# Patient Record
Sex: Male | Born: 1945 | Race: White | Hispanic: No | Marital: Married | Smoking: Former smoker
Health system: Southern US, Community
[De-identification: ages and names within clinical notes are randomized; demographics above are authoritative.]

## PROBLEM LIST (undated history)

## (undated) DIAGNOSIS — Z9889 Other specified postprocedural states: Secondary | ICD-10-CM

## (undated) DIAGNOSIS — I739 Peripheral vascular disease, unspecified: Secondary | ICD-10-CM

## (undated) DIAGNOSIS — R112 Nausea with vomiting, unspecified: Secondary | ICD-10-CM

## (undated) DIAGNOSIS — L989 Disorder of the skin and subcutaneous tissue, unspecified: Secondary | ICD-10-CM

## (undated) DIAGNOSIS — G510 Bell's palsy: Secondary | ICD-10-CM

## (undated) DIAGNOSIS — N400 Enlarged prostate without lower urinary tract symptoms: Secondary | ICD-10-CM

## (undated) DIAGNOSIS — I82409 Acute embolism and thrombosis of unspecified deep veins of unspecified lower extremity: Secondary | ICD-10-CM

## (undated) DIAGNOSIS — H25019 Cortical age-related cataract, unspecified eye: Secondary | ICD-10-CM

## (undated) DIAGNOSIS — M1990 Unspecified osteoarthritis, unspecified site: Secondary | ICD-10-CM

## (undated) DIAGNOSIS — K219 Gastro-esophageal reflux disease without esophagitis: Secondary | ICD-10-CM

## (undated) DIAGNOSIS — R739 Hyperglycemia, unspecified: Secondary | ICD-10-CM

## (undated) HISTORY — PX: EYE SURGERY: SHX253

## (undated) HISTORY — PX: OTHER SURGICAL HISTORY: SHX169

---

## 1988-09-23 HISTORY — PX: SHOULDER SURGERY: SHX246

## 1993-09-23 HISTORY — PX: SHOULDER ARTHROSCOPY WITH ROTATOR CUFF REPAIR: SHX5685

## 2007-09-24 HISTORY — PX: HERNIA REPAIR: SHX51

## 2010-07-21 ENCOUNTER — Encounter: Admission: RE | Admit: 2010-07-21 | Discharge: 2010-07-21 | Payer: Self-pay | Admitting: Orthopedic Surgery

## 2013-07-23 ENCOUNTER — Ambulatory Visit: Payer: Self-pay | Admitting: Gastroenterology

## 2013-07-23 LAB — CBC WITH DIFFERENTIAL/PLATELET
Basophil #: 0 10*3/uL (ref 0.0–0.1)
Basophil %: 0.6 %
Eosinophil #: 0.1 10*3/uL (ref 0.0–0.7)
Eosinophil %: 1.2 %
HCT: 41.9 % (ref 40.0–52.0)
HGB: 14.4 g/dL (ref 13.0–18.0)
Lymphocyte #: 1.6 10*3/uL (ref 1.0–3.6)
Lymphocyte %: 29.7 %
MCH: 29.8 pg (ref 26.0–34.0)
MCHC: 34.4 g/dL (ref 32.0–36.0)
Monocyte %: 7.3 %
Neutrophil %: 61.2 %
Platelet: 135 10*3/uL — ABNORMAL LOW (ref 150–440)
RBC: 4.83 10*6/uL (ref 4.40–5.90)
RDW: 14.1 % (ref 11.5–14.5)
WBC: 5.3 10*3/uL (ref 3.8–10.6)

## 2013-07-27 LAB — PATHOLOGY REPORT

## 2016-03-14 ENCOUNTER — Encounter: Payer: Self-pay | Admitting: Emergency Medicine

## 2016-03-14 ENCOUNTER — Emergency Department
Admission: EM | Admit: 2016-03-14 | Discharge: 2016-03-14 | Disposition: A | Discharge status: Home / Self Care | Payer: Medicare Other | Attending: Emergency Medicine | Admitting: Emergency Medicine

## 2016-03-14 DIAGNOSIS — Y9389 Activity, other specified: Secondary | ICD-10-CM | POA: Diagnosis not present

## 2016-03-14 DIAGNOSIS — Y999 Unspecified external cause status: Secondary | ICD-10-CM | POA: Insufficient documentation

## 2016-03-14 DIAGNOSIS — S61011A Laceration without foreign body of right thumb without damage to nail, initial encounter: Secondary | ICD-10-CM | POA: Insufficient documentation

## 2016-03-14 DIAGNOSIS — Y929 Unspecified place or not applicable: Secondary | ICD-10-CM | POA: Diagnosis not present

## 2016-03-14 DIAGNOSIS — W269XXA Contact with unspecified sharp object(s), initial encounter: Secondary | ICD-10-CM | POA: Insufficient documentation

## 2016-03-14 MED ORDER — TETANUS-DIPHTH-ACELL PERTUSSIS 5-2.5-18.5 LF-MCG/0.5 IM SUSP
0.5000 mL | Freq: Once | INTRAMUSCULAR | Status: AC
  Administered 2016-03-14: 0.5 mL via INTRAMUSCULAR
  Filled 2016-03-14: qty 0.5

## 2016-03-14 MED ORDER — LIDOCAINE HCL (PF) 1 % IJ SOLN
INTRAMUSCULAR | Status: AC
  Filled 2016-03-14: qty 5

## 2016-03-14 MED ORDER — TRAMADOL HCL 50 MG PO TABS: 50.0000 mg | ORAL_TABLET | Freq: Four times a day (QID) | ORAL | Status: DC | PRN

## 2016-03-14 NOTE — ED Notes (Signed)
Grating cheese on a grater, cheese was frozen and cheese slipped, cutting right thumb.

## 2016-03-14 NOTE — ED Notes (Signed)
Pt reports laceration to right thumb. Pt wound dressed with kerlix in triage. Blood noted to dressing. Explained to pt to keep wrapped until provider at bedside.

## 2016-03-14 NOTE — ED Provider Notes (Signed)
[facility] Emergency Department Provider Note   ____________________________________________  Time seen: Approximately 7:05 PM  I have reviewed the triage vital signs and the nursing notes.   HISTORY  Chief Complaint Extremity Laceration    HPI Dwayne Smith is a 70 y.o. male patient's laceration to the right thumb. Patient state was grating cheese and  right thumb slipped and was cut on the greater.Hemorrhage  control with direct pressure. Patient denies loss sensation or loss of function of the right thumb. Patient denies any pain at this time. Patient state last tetanus shot greater than 10 years. No palliative measures for this complaint. Patient is right-hand dominant.   History reviewed. No pertinent past medical history.  There are no active problems to display for this patient.   History reviewed. No pertinent past surgical history.  Current Outpatient Rx  Name  Route  Sig  Dispense  Refill  . traMADol (ULTRAM) 50 MG tablet   Oral   Take 1 tablet (50 mg total) by mouth every 6 (six) hours as needed for moderate pain.   12 tablet   0     Allergies Review of patient's allergies indicates no known allergies.  No family history on file.  Social History Social History  Substance Use Topics  . Smoking status: Never Smoker   . Smokeless tobacco: None  . Alcohol Use: None    Review of Systems Constitutional: No fever/chills Eyes: No visual changes. ENT: No sore throat. Cardiovascular: Denies chest pain. Respiratory: Denies shortness of breath. Gastrointestinal: No abdominal pain.  No nausea, no vomiting.  No diarrhea.  No constipation. Genitourinary: Negative for dysuria. Musculoskeletal: Negative for back pain. Skin: Negative for rash. Neurological: Negative for headaches, focal weakness or numbness.  ____________________________________________   PHYSICAL EXAM:  VITAL SIGNS: ED Triage Vitals  Enc Vitals Group     BP  03/14/16 1829 162/82 mmHg     Pulse Rate 03/14/16 1829 69     Resp 03/14/16 1829 15     Temp 03/14/16 1829 98.3 F (36.8 C)     Temp Source 03/14/16 1829 Oral     SpO2 03/14/16 1829 100 %     Weight 03/14/16 1819 160 lb (72.576 kg)     Height 03/14/16 1819 5\' 8"  (1.727 m)     Head Cir --      Peak Flow --      Pain Score 03/14/16 1820 0     Pain Loc --      Pain Edu? --      Excl. in [place]? --    Constitutional: Alert and oriented. Well appearing and in no acute distress. Eyes: Conjunctivae are normal. PERRL. EOMI. Head: Atraumatic. Nose: No congestion/rhinnorhea. Mouth/Throat: Mucous membranes are moist.  Oropharynx non-erythematous. Neck: No stridor.  No cervical spine tenderness to palpation. Hematological/Lymphatic/Immunilogical: No cervical lymphadenopathy. Cardiovascular: Normal rate, regular rhythm. Grossly normal heart sounds.  Good peripheral circulation.Elevated blood pressure  Respiratory: Normal respiratory effort.  No retractions. Lungs CTAB. Gastrointestinal: Soft and nontender. No distention. No abdominal bruits. No CVA tenderness. Musculoskeletal: No lower extremity tenderness nor edema.  No joint effusions. Neurologic:  Normal speech and language. No gross focal neurologic deficits are appreciated. No gait instability. Skin:  Skin is warm, dry and intact. No rash noted. Laceration right thumb Psychiatric: Mood and affect are normal. Speech and behavior are normal.  ____________________________________________   LABS (all labs ordered are listed, but only abnormal results are displayed)  Labs Reviewed -  No data to display ____________________________________________  EKG   ____________________________________________  [id number]   ____________________________________________   PROCEDURES  Procedure(s) performed: LACERATION REPAIR Performed by: Sable Feil Authorized by: Sable Feil Consent: Verbal consent obtained. Risks and benefits: risks,  benefits and alternatives were discussed Consent given by: patient Patient identity confirmed: provided demographic data Prepped and Draped in normal sterile fashion Wound explored  Laceration Location: Right dorsal thumb  Laceration Length: 1.5cm  No Foreign Bodies seen or palpated  Anesthesia: Digital block   Local anesthetic: lidocaine 1% without epinephrine  Anesthetic total: 4 ml  Irrigation method: syringe Amount of cleaning: standard  Skin closure: 4-0 nylon   Number of sutures: 5   Technique: Interrupted Patient tolerance: Patient tolerated the procedure well with no immediate complications.   Critical Care performed: No  ____________________________________________   INITIAL IMPRESSION / ASSESSMENT AND PLAN / ED COURSE  Pertinent labs & imaging results that were available during my care of the patient were reviewed by me and considered in my medical decision making (see chart for details).  Right thumb laceration. Patient given discharge care instructions. Patient advised his sutures removed in 10 days. Patient advised family doctor urgent care clinic and removed the sutures. ____________________________________________   FINAL CLINICAL IMPRESSION(S) / ED DIAGNOSES  Final diagnoses:  Thumb laceration, right, initial encounter      NEW MEDICATIONS STARTED DURING THIS VISIT:  New Prescriptions   TRAMADOL (ULTRAM) 50 MG TABLET    Take 1 tablet (50 mg total) by mouth every 6 (six) hours as needed for moderate pain.     Note:  This document was prepared using Dragon voice recognition software and may include unintentional dictation errors.    Sable Feil, PA-C 03/14/16 [place], MD 03/14/16 2018

## 2016-03-14 NOTE — Discharge Instructions (Signed)
Wear splint for 2-3 days. Laceration Care, Adult A laceration is a cut that goes through all layers of the skin. The cut also goes into the tissue that is right under the skin. Some cuts heal on their own. Others need to be closed with stitches (sutures), staples, skin adhesive strips, or wound glue. Taking care of your cut lowers your risk of infection and helps your cut to heal better. HOW TO TAKE CARE OF YOUR CUT For stitches or staples:  Keep the wound clean and dry.  If you were given a bandage (dressing), you should change it at least one time per day or as told by your doctor. You should also change it if it gets wet or dirty.  Keep the wound completely dry for the first 24 hours or as told by your doctor. After that time, you may take a shower or a bath. However, make sure that the wound is not soaked in water until after the stitches or staples have been removed.  Clean the wound one time each day or as told by your doctor:  Wash the wound with soap and water.  Rinse the wound with water until all of the soap comes off.  Pat the wound dry with a clean towel. Do not rub the wound.  After you clean the wound, put a thin layer of antibiotic ointment on it as told by your doctor. This ointment:  Helps to prevent infection.  Keeps the bandage from sticking to the wound.  Have your stitches or staples removed as told by your doctor. If your doctor used skin adhesive strips:   Keep the wound clean and dry.  If you were given a bandage, you should change it at least one time per day or as told by your doctor. You should also change it if it gets dirty or wet.  Do not get the skin adhesive strips wet. You can take a shower or a bath, but be careful to keep the wound dry.  If the wound gets wet, pat it dry with a clean towel. Do not rub the wound.  Skin adhesive strips fall off on their own. You can trim the strips as the wound heals. Do not remove any strips that are still stuck  to the wound. They will fall off after a while. If your doctor used wound glue:  Try to keep your wound dry, but you may briefly wet it in the shower or bath. Do not soak the wound in water, such as by swimming.  After you take a shower or a bath, gently pat the wound dry with a clean towel. Do not rub the wound.  Do not do any activities that will make you really sweaty until the skin glue has fallen off on its own.  Do not apply liquid, cream, or ointment medicine to your wound while the skin glue is still on.  If you were given a bandage, you should change it at least one time per day or as told by your doctor. You should also change it if it gets dirty or wet.  If a bandage is placed over the wound, do not let the tape for the bandage touch the skin glue.  Do not pick at the glue. The skin glue usually stays on for 5-10 days. Then, it falls off of the skin. General Instructions  To help prevent scarring, make sure to cover your wound with sunscreen whenever you are outside after stitches are removed,  after adhesive strips are removed, or when wound glue stays in place and the wound is healed. Make sure to wear a sunscreen of at least 30 SPF.  Take over-the-counter and prescription medicines only as told by your doctor.  If you were given antibiotic medicine or ointment, take or apply it as told by your doctor. Do not stop using the antibiotic even if your wound is getting better.  Do not scratch or pick at the wound.  Keep all follow-up visits as told by your doctor. This is important.  Check your wound every day for signs of infection. Watch for:  Redness, swelling, or pain.  Fluid, blood, or pus.  Raise (elevate) the injured area above the level of your heart while you are sitting or lying down, if possible. GET HELP IF:  You got a tetanus shot and you have any of these problems at the injection site:  Swelling.  Very bad pain.  Redness.  Bleeding.  You have a  fever.  A wound that was closed breaks open.  You notice a bad smell coming from your wound or your bandage.  You notice something coming out of the wound, such as wood or glass.  Medicine does not help your pain.  You have more redness, swelling, or pain at the site of your wound.  You have fluid, blood, or pus coming from your wound.  You notice a change in the color of your skin near your wound.  You need to change the bandage often because fluid, blood, or pus is coming from the wound.  You start to have a new rash.  You start to have numbness around the wound. GET HELP RIGHT AWAY IF:  You have very bad swelling around the wound.  Your pain suddenly gets worse and is very bad.  You notice painful lumps near the wound or on skin that is anywhere on your body.  You have a red streak going away from your wound.  The wound is on your hand or foot and you cannot move a finger or toe like you usually can.  The wound is on your hand or foot and you notice that your fingers or toes look pale or bluish.   This information is not intended to replace advice given to you by your health care provider. Make sure you discuss any questions you have with your health care provider.   Document Released: 02/26/2008 Document Revised: 01/24/2015 Document Reviewed: 09/05/2014 Elsevier Interactive Patient Education [employer].

## 2017-03-25 ENCOUNTER — Other Ambulatory Visit: Payer: Self-pay | Admitting: Internal Medicine

## 2017-03-25 ENCOUNTER — Ambulatory Visit
Admission: RE | Admit: 2017-03-25 | Discharge: 2017-03-25 | Disposition: A | Discharge status: Home / Self Care | Payer: Medicare Other | Source: Ambulatory Visit | Attending: Internal Medicine | Admitting: Internal Medicine

## 2017-03-25 DIAGNOSIS — M79605 Pain in left leg: Secondary | ICD-10-CM

## 2017-04-17 ENCOUNTER — Encounter: Payer: Self-pay | Admitting: *Deleted

## 2017-04-17 ENCOUNTER — Emergency Department
Admission: EM | Admit: 2017-04-17 | Discharge: 2017-04-17 | Disposition: A | Discharge status: Home / Self Care | Payer: Medicare Other | Attending: Emergency Medicine | Admitting: Emergency Medicine

## 2017-04-17 DIAGNOSIS — Y92017 Garden or yard in single-family (private) house as the place of occurrence of the external cause: Secondary | ICD-10-CM | POA: Diagnosis not present

## 2017-04-17 DIAGNOSIS — S0502XA Injury of conjunctiva and corneal abrasion without foreign body, left eye, initial encounter: Secondary | ICD-10-CM | POA: Insufficient documentation

## 2017-04-17 DIAGNOSIS — T1592XA Foreign body on external eye, part unspecified, left eye, initial encounter: Secondary | ICD-10-CM

## 2017-04-17 DIAGNOSIS — W458XXA Other foreign body or object entering through skin, initial encounter: Secondary | ICD-10-CM | POA: Diagnosis not present

## 2017-04-17 DIAGNOSIS — Y998 Other external cause status: Secondary | ICD-10-CM | POA: Insufficient documentation

## 2017-04-17 DIAGNOSIS — Y93A6 Activity, grass drills: Secondary | ICD-10-CM | POA: Insufficient documentation

## 2017-04-17 MED ORDER — FLUORESCEIN SODIUM 0.6 MG OP STRP
ORAL_STRIP | OPHTHALMIC | Status: AC
  Filled 2017-04-17: qty 1

## 2017-04-17 MED ORDER — FLUORESCEIN SODIUM 0.6 MG OP STRP
1.0000 | ORAL_STRIP | Freq: Once | OPHTHALMIC | Status: AC
  Administered 2017-04-17: 1 via OPHTHALMIC
  Filled 2017-04-17: qty 1

## 2017-04-17 MED ORDER — ERYTHROMYCIN 5 MG/GM OP OINT: TOPICAL_OINTMENT | Freq: Three times a day (TID) | OPHTHALMIC | 0 refills | Status: AC

## 2017-04-17 MED ORDER — ERYTHROMYCIN 5 MG/GM OP OINT
TOPICAL_OINTMENT | Freq: Once | OPHTHALMIC | Status: AC
  Administered 2017-04-17: 1 via OPHTHALMIC
  Filled 2017-04-17: qty 1

## 2017-04-17 MED ORDER — TETRACAINE HCL 0.5 % OP SOLN
2.0000 [drp] | Freq: Once | OPHTHALMIC | Status: AC
  Administered 2017-04-17: 2 [drp] via OPHTHALMIC
  Filled 2017-04-17: qty 4

## 2017-04-17 MED ORDER — CIPROFLOXACIN HCL 0.3 % OP SOLN: 1.0000 [drp] | OPHTHALMIC | 0 refills | Status: AC

## 2017-04-17 MED ORDER — HYDROCODONE-ACETAMINOPHEN 5-325 MG PO TABS: 1.0000 | ORAL_TABLET | Freq: Four times a day (QID) | ORAL | 0 refills | Status: DC | PRN

## 2017-04-17 MED ORDER — CIPROFLOXACIN HCL 0.3 % OP SOLN
1.0000 [drp] | OPHTHALMIC | Status: DC
  Administered 2017-04-17: 1 [drp] via OPHTHALMIC
  Filled 2017-04-17: qty 2.5

## 2017-04-17 NOTE — ED Provider Notes (Signed)
[facility] Provider Note   CSN: [id number] Arrival date & time: 04/17/17  2026     History   Chief Complaint Chief Complaint  Patient presents with  . Foreign Body in [place] is a 71 y.o. male presents to the emergency department for evaluation of left eye pain. Patient states earlier today he was mowing. After mowing he was sitting down and felt something in his left eye. Patient states it could've been a piece of gravel from the shingles on the roof. Patient was rubbing his left eye, caused pain to the left eye. Pain is severe with blinking. He currently has a foreign body sensation with water into the left eye. Patient feels as a foreign body is moving around his left eye. He denies any photophobia, headache, nausea, vomiting. Patient's vision is at its baseline but a lot of blurriness to the left eye. He denies any burning facial pain or rashes.  HPI  No past medical history on file.  There are no active problems to display for this patient.   No past surgical history on file.     Home Medications    Prior to Admission medications   Medication Sig Start Date End Date Taking? Authorizing Provider  ciprofloxacin (CILOXAN) 0.3 % ophthalmic solution Place 1 drop into both eyes every 2 (two) hours. Administer 1 drop, every 2 hours, while awake, for 2 days. Then 1 drop, every 4 hours, while awake, for the next 5 days. 04/17/17 04/22/17  Duanne Guess, PA-C  erythromycin [facility]) ophthalmic ointment Place into the left eye 3 (three) times daily. Place a 1/2 inch ribbon of ointment into the lower eyelid. 04/17/17 04/27/17  Duanne Guess, PA-C  HYDROcodone-acetaminophen (NORCO) 5-325 MG tablet Take 1 tablet by mouth every 6 (six) hours as needed for moderate pain. 04/17/17   Duanne Guess, PA-C  traMADol (ULTRAM) 50 MG tablet Take 1 tablet (50 mg total) by mouth every 6 (six) hours as needed for moderate pain. 03/14/16   Sable Feil, PA-C     Family History No family history on file.  Social History Social History  Substance Use Topics  . Smoking status: Never Smoker  . Smokeless tobacco: Never Used  . Alcohol use Yes     Allergies   Patient has no known allergies.   Review of Systems Review of Systems  Constitutional: Negative for fever.  Eyes: Positive for pain, discharge and redness. Negative for photophobia and visual disturbance.  Gastrointestinal: Negative for nausea and vomiting.  Skin: Negative for rash.  Neurological: Negative for headaches.     Physical Exam Updated Vital Signs BP (!) 176/94 (BP Location: Left Arm)   Pulse 90   Temp 98.4 F (36.9 C) (Oral)   Resp 20   Ht 5\' 8"  (1.727 m)   Wt 74.8 kg (165 lb)   SpO2 97%   BMI 25.09 kg/m   Physical Exam  Constitutional: He is oriented to person, place, and time. He appears well-developed and well-nourished.  HENT:  Head: Normocephalic and atraumatic.  Right Ear: External ear normal.  Left Ear: External ear normal.  Nose: Nose normal.  Eyes: Pupils are equal, round, and reactive to light. EOM are normal. Left eye exhibits no discharge and no exudate. Foreign body (small gr of sand left eye) present in the left eye. Left conjunctiva is injected. Left conjunctiva has no hemorrhage. Left eye exhibits normal extraocular motion.  Fundoscopic exam:  The left eye shows no papilledema. The left eye shows no red reflex.  Slit lamp exam:      The left eye shows corneal abrasion, foreign body and fluorescein uptake. The left eye shows no corneal ulcer and no anterior chamber bulge.    Neck: Normal range of motion.  Cardiovascular: Regular rhythm.   Pulmonary/Chest: Effort normal. No respiratory distress.  Musculoskeletal: Normal range of motion. He exhibits no edema.  Neurological: He is alert and oriented to person, place, and time. No cranial nerve deficit.  Skin: Skin is warm. No erythema.  Psychiatric: He has a normal mood and affect.  His behavior is normal. Judgment and thought content normal.     ED Treatments / Results  Labs (all labs ordered are listed, but only abnormal results are displayed) Labs Reviewed - No data to display  EKG  EKG Interpretation None       Radiology No results found.  Procedures Procedures (including critical care time)  Medications Ordered in ED Medications  fluorescein 0.6 MG ophthalmic strip (not administered)  ciprofloxacin (CILOXAN) 0.3 % ophthalmic solution 1 drop (not administered)  erythromycin ophthalmic ointment (not administered)  fluorescein ophthalmic strip 1 strip (1 strip Left Eye Given by Other 04/17/17 2147)  tetracaine (PONTOCAINE) 0.5 % ophthalmic solution 2 drop (2 drops Left Eye Given by Other 04/17/17 2147)     Initial Impression / Assessment and Plan / ED Course  I have reviewed the triage vital signs and the nursing notes.  Pertinent labs & imaging results that were available during my care of the patient were reviewed by me and considered in my medical decision making (see chart for details).     71 year old male with foreign body sensation to the left eye. Patient rubbing his left eye increasing pain with blurred vision. Floor seen stain and tetracaine applied and showed a large corneal abrasion approximately 3 mm in diameter. No significant ulceration. Foreign body, gr of sand removed with Q-tip. Lids everted and no other foreign body visualized. Patient is given antibiotic limited use at nighttime to help lubricate the left eye. He can use antibiotic eyedrops during the day. He will call ophthalmology tomorrow to schedule follow-up appointment. He is given Norco for pain. She is educated on signs and symptoms return to the ED for.  Final Clinical Impressions(s) / ED Diagnoses   Final diagnoses:  Abrasion of left cornea, initial encounter  FB eye, left, initial encounter    New Prescriptions New Prescriptions   CIPROFLOXACIN (CILOXAN) 0.3 %  OPHTHALMIC SOLUTION    Place 1 drop into both eyes every 2 (two) hours. Administer 1 drop, every 2 hours, while awake, for 2 days. Then 1 drop, every 4 hours, while awake, for the next 5 days.   ERYTHROMYCIN (ROMYCIN) OPHTHALMIC OINTMENT    Place into the left eye 3 (three) times daily. Place a 1/2 inch ribbon of ointment into the lower eyelid.   HYDROCODONE-ACETAMINOPHEN (NORCO) 5-325 MG TABLET    Take 1 tablet by mouth every 6 (six) hours as needed for moderate pain.     Renata Caprice 04/17/17 2207    Nena Polio, MD 04/17/17 2256

## 2017-04-17 NOTE — ED Triage Notes (Signed)
Pt has FB in left eye.  Occurred today after mowing the yard at 1700.  Left eye is red.  Pt used some eyes drops without pain relief.  Pt alert.

## 2017-04-17 NOTE — Discharge Instructions (Signed)
Please use antibiotic eyedrops during the day and ointment at nighttime. Please call ophthalmologist tomorrow to schedule follow-up appointment. Take pain medication as needed for pain. Return to the ER for any worsening symptoms or urgent changes in her health.

## 2017-04-17 NOTE — ED Notes (Signed)
Patient verbalizes understanding of d/c instructions and follow-up. VS stable and pain controlled per patient.  Patient in NAD at time of d/c and denies further concerns regarding this visit. Patient stable at the time of departure from the unit, departing unit by the safest and most appropriate manner per that patients condition and limitations. Patient advised to return to the ED at any time for emergent concerns, or for new/worsening symptoms.   

## 2017-04-17 NOTE — ED Notes (Signed)
Patient was mowing, got unknown object in the eye. Eye is red and watery. Patient feels like it is up top in the eye.

## 2018-09-03 ENCOUNTER — Encounter: Payer: Self-pay | Admitting: *Deleted

## 2018-09-04 ENCOUNTER — Other Ambulatory Visit: Payer: Self-pay

## 2018-09-04 ENCOUNTER — Ambulatory Visit: Payer: Medicare Other | Admitting: Anesthesiology

## 2018-09-04 ENCOUNTER — Ambulatory Visit
Admission: RE | Admit: 2018-09-04 | Discharge: 2018-09-04 | Disposition: A | Discharge status: Home / Self Care | Payer: Medicare Other | Attending: Gastroenterology | Admitting: Gastroenterology

## 2018-09-04 ENCOUNTER — Encounter
Admission: RE | Disposition: A | Discharge status: Home / Self Care | Payer: Self-pay | Source: Home / Self Care | Attending: Gastroenterology

## 2018-09-04 DIAGNOSIS — I739 Peripheral vascular disease, unspecified: Secondary | ICD-10-CM | POA: Insufficient documentation

## 2018-09-04 DIAGNOSIS — Z87891 Personal history of nicotine dependence: Secondary | ICD-10-CM | POA: Diagnosis not present

## 2018-09-04 DIAGNOSIS — K529 Noninfective gastroenteritis and colitis, unspecified: Secondary | ICD-10-CM | POA: Insufficient documentation

## 2018-09-04 DIAGNOSIS — N400 Enlarged prostate without lower urinary tract symptoms: Secondary | ICD-10-CM | POA: Diagnosis not present

## 2018-09-04 DIAGNOSIS — Z79899 Other long term (current) drug therapy: Secondary | ICD-10-CM | POA: Insufficient documentation

## 2018-09-04 DIAGNOSIS — K219 Gastro-esophageal reflux disease without esophagitis: Secondary | ICD-10-CM | POA: Diagnosis not present

## 2018-09-04 DIAGNOSIS — K319 Disease of stomach and duodenum, unspecified: Secondary | ICD-10-CM | POA: Insufficient documentation

## 2018-09-04 DIAGNOSIS — K5730 Diverticulosis of large intestine without perforation or abscess without bleeding: Secondary | ICD-10-CM | POA: Insufficient documentation

## 2018-09-04 DIAGNOSIS — Z86010 Personal history of colonic polyps: Secondary | ICD-10-CM | POA: Diagnosis not present

## 2018-09-04 DIAGNOSIS — Z7982 Long term (current) use of aspirin: Secondary | ICD-10-CM | POA: Diagnosis not present

## 2018-09-04 DIAGNOSIS — Z1211 Encounter for screening for malignant neoplasm of colon: Secondary | ICD-10-CM | POA: Diagnosis not present

## 2018-09-04 DIAGNOSIS — D122 Benign neoplasm of ascending colon: Secondary | ICD-10-CM | POA: Insufficient documentation

## 2018-09-04 DIAGNOSIS — K449 Diaphragmatic hernia without obstruction or gangrene: Secondary | ICD-10-CM | POA: Insufficient documentation

## 2018-09-04 DIAGNOSIS — K2270 Barrett's esophagus without dysplasia: Secondary | ICD-10-CM | POA: Insufficient documentation

## 2018-09-04 HISTORY — DX: Benign prostatic hyperplasia without lower urinary tract symptoms: N40.0

## 2018-09-04 HISTORY — DX: Other specified postprocedural states: Z98.890

## 2018-09-04 HISTORY — PX: COLONOSCOPY WITH PROPOFOL: SHX5780

## 2018-09-04 HISTORY — DX: Peripheral vascular disease, unspecified: I73.9

## 2018-09-04 HISTORY — DX: Hyperglycemia, unspecified: R73.9

## 2018-09-04 HISTORY — DX: Unspecified osteoarthritis, unspecified site: M19.90

## 2018-09-04 HISTORY — DX: Other specified postprocedural states: R11.2

## 2018-09-04 HISTORY — PX: ESOPHAGOGASTRODUODENOSCOPY (EGD) WITH PROPOFOL: SHX5813

## 2018-09-04 HISTORY — DX: Cortical age-related cataract, unspecified eye: H25.019

## 2018-09-04 LAB — CBC WITH DIFFERENTIAL/PLATELET
ABS IMMATURE GRANULOCYTES: 0.01 10*3/uL (ref 0.00–0.07)
Basophils Absolute: 0 10*3/uL (ref 0.0–0.1)
Basophils Relative: 0 %
Eosinophils Absolute: 0.1 10*3/uL (ref 0.0–0.5)
Eosinophils Relative: 2 %
HCT: 45.8 % (ref 39.0–52.0)
Hemoglobin: 15.6 g/dL (ref 13.0–17.0)
Immature Granulocytes: 0 %
Lymphocytes Relative: 40 %
Lymphs Abs: 1.6 10*3/uL (ref 0.7–4.0)
MCH: 29.4 pg (ref 26.0–34.0)
MCHC: 34.1 g/dL (ref 30.0–36.0)
MCV: 86.4 fL (ref 80.0–100.0)
MONO ABS: 0.3 10*3/uL (ref 0.1–1.0)
Monocytes Relative: 8 %
Neutro Abs: 2 10*3/uL (ref 1.7–7.7)
Neutrophils Relative %: 50 %
Platelets: 138 10*3/uL — ABNORMAL LOW (ref 150–400)
RBC: 5.3 MIL/uL (ref 4.22–5.81)
RDW: 13 % (ref 11.5–15.5)
WBC: 4 10*3/uL (ref 4.0–10.5)
nRBC: 0 % (ref 0.0–0.2)

## 2018-09-04 SURGERY — COLONOSCOPY WITH PROPOFOL
Anesthesia: General

## 2018-09-04 MED ORDER — LIDOCAINE HCL (PF) 2 % IJ SOLN
INTRAMUSCULAR | Status: AC
  Filled 2018-09-04: qty 10

## 2018-09-04 MED ORDER — PROPOFOL 10 MG/ML IV BOLUS
INTRAVENOUS | Status: DC | PRN
  Administered 2018-09-04 (×2): 20 mg via INTRAVENOUS
  Administered 2018-09-04: 90 mg via INTRAVENOUS

## 2018-09-04 MED ORDER — PROPOFOL 500 MG/50ML IV EMUL
INTRAVENOUS | Status: AC
  Filled 2018-09-04: qty 50

## 2018-09-04 MED ORDER — LIDOCAINE HCL (CARDIAC) PF 100 MG/5ML IV SOSY
PREFILLED_SYRINGE | INTRAVENOUS | Status: DC | PRN
  Administered 2018-09-04: 50 mg via INTRAVENOUS

## 2018-09-04 MED ORDER — SODIUM CHLORIDE 0.9 % IV SOLN
INTRAVENOUS | Status: DC
  Administered 2018-09-04: 10:00:00 via INTRAVENOUS

## 2018-09-04 MED ORDER — PROPOFOL 500 MG/50ML IV EMUL
INTRAVENOUS | Status: DC | PRN
  Administered 2018-09-04: 135 ug/kg/min via INTRAVENOUS

## 2018-09-04 NOTE — OR Nursing (Signed)
Lab here to draw CBC with diff.

## 2018-09-04 NOTE — Anesthesia Postprocedure Evaluation (Signed)
Anesthesia Post Note  Patient: DVON JILES  Procedure(s) Performed: COLONOSCOPY WITH PROPOFOL (N/A ) ESOPHAGOGASTRODUODENOSCOPY (EGD) WITH PROPOFOL (N/A )  Patient location during evaluation: Endoscopy Anesthesia Type: General Level of consciousness: awake and alert and oriented Pain management: pain level controlled Vital Signs Assessment: post-procedure vital signs reviewed and stable Respiratory status: spontaneous breathing, nonlabored ventilation and respiratory function stable Cardiovascular status: blood pressure returned to baseline and stable Postop Assessment: no signs of nausea or vomiting Anesthetic complications: no     Last Vitals:  Vitals:   09/04/18 1107 09/04/18 1136  BP:  (!) 147/96  Pulse:    Resp:    Temp: (!) 36.1 C   SpO2:      Last Pain:  Vitals:   09/04/18 1112  TempSrc:   PainSc: 0-No pain                 Maile Linford

## 2018-09-04 NOTE — Anesthesia Post-op Follow-up Note (Signed)
Anesthesia QCDR form completed.        

## 2018-09-04 NOTE — Op Note (Signed)
[facility] Patient Name: Dwayne Smith Procedure Date: 09/04/2018 10:14 AM MRN: [id number] Account #: [id number] Date of Birth: 1946/03/14 Admit Type: Outpatient Age: 72 Room: [facility] ENDO ROOM 3 Gender: Male Note Status: Finalized Procedure:            Colonoscopy Indications:          Personal history of colonic polyps Providers:            Lollie Sails, MD Referring MD:         Leonie Douglas. Doy Hutching, MD (Referring MD) Medicines:            Monitored Anesthesia Care Complications:        No immediate complications. Procedure:            Pre-Anesthesia Assessment:                       - ASA Grade Assessment: III - A patient with severe                        systemic disease.                       After obtaining informed consent, the colonoscope was                        passed under direct vision. Throughout the procedure,                        the patient's blood pressure, pulse, and oxygen                        saturations were monitored continuously. The                        Colonoscope was introduced through the anus and                        advanced to the the cecum, identified by appendiceal                        orifice and ileocecal valve. The colonoscopy was                        performed without difficulty. The patient tolerated the                        procedure well. The quality of the bowel preparation                        was good. Findings:      Multiple small and large-mouthed diverticula were found in the sigmoid       colon, descending colon, transverse colon and ascending colon.      A 2 mm polyp was found in the sigmoid colon. The polyp was sessile. The       polyp was removed with a cold biopsy forceps. Resection and retrieval       were complete.      Two sessile polyps were found in the ascending colon. The polyps were 2       to 3 mm in size. These polyps were removed with a cold  biopsy forceps.   Resection and retrieval were complete.      Non-bleeding internal hemorrhoids were found during retroflexion and       during anoscopy. The hemorrhoids were small and Grade I (internal       hemorrhoids that do not prolapse).      No additional abnormalities were found on retroflexion. Impression:           - Diverticulosis in the sigmoid colon, in the                        descending colon, in the transverse colon and in the                        ascending colon.                       - One 2 mm polyp in the sigmoid colon, removed with a                        cold biopsy forceps. Resected and retrieved.                       - Two 2 to 3 mm polyps in the ascending colon, removed                        with a cold biopsy forceps. Resected and retrieved.                       - Non-bleeding internal hemorrhoids. Recommendation:       - Discharge patient to home.                       - Await pathology results.                       - Telephone GI clinic for pathology results in 1 week. Procedure Code(s):    --- Professional ---                       [phone], Colonoscopy, flexible; with biopsy, single or                        multiple Diagnosis Code(s):    --- Professional ---                       K64.0, First degree hemorrhoids                       D12.5, Benign neoplasm of sigmoid colon                       D12.2, Benign neoplasm of ascending colon                       Z86.010, Personal history of colonic polyps                       K57.30, Diverticulosis of large intestine without                        perforation or abscess without bleeding CPT copyright 2018 American  Medical Association. All rights reserved. The codes documented in this report are preliminary and upon coder review may  be revised to meet current compliance requirements. Lollie Sails, MD 09/04/2018 11:06:35 AM This report has been signed electronically. Number of Addenda: 0 Note Initiated On:  09/04/2018 10:14 AM Scope Withdrawal Time: 0 hours 10 minutes 54 seconds  Total Procedure Duration: 0 hours 21 minutes 4 seconds       [facility]

## 2018-09-04 NOTE — Anesthesia Preprocedure Evaluation (Signed)
Anesthesia Evaluation  Patient identified by MRN, date of birth, ID band Patient awake    Reviewed: Allergy & Precautions, NPO status , Patient's Chart, lab work & pertinent test results  History of Anesthesia Complications (+) PONV and history of anesthetic complications  Airway Mallampati: II  TM Distance: >3 FB Neck ROM: Full    Dental  (+) Poor Dentition   Pulmonary neg sleep apnea, neg COPD, former smoker,    breath sounds clear to auscultation- rhonchi (-) wheezing      Cardiovascular Exercise Tolerance: Good (-) hypertension+ Peripheral Vascular Disease  (-) CAD, (-) Past MI, (-) Cardiac Stents and (-) CABG  Rhythm:Regular Rate:Normal - Systolic murmurs and - Diastolic murmurs    Neuro/Psych neg Seizures negative neurological ROS  negative psych ROS   GI/Hepatic negative GI ROS, Neg liver ROS,   Endo/Other  negative endocrine ROSneg diabetes  Renal/GU negative Renal ROS     Musculoskeletal  (+) Arthritis ,   Abdominal (+) - obese,   Peds  Hematology negative hematology ROS (+)   Anesthesia Other Findings Past Medical History: No date: Arthritis No date: BPH (benign prostatic hyperplasia) No date: Cataract cortical, senile No date: Hyperglycemia No date: PONV (postoperative nausea and vomiting)     Comment:  shoulder surgery at [facility] No date: PVD (peripheral vascular disease) (HCC)   Reproductive/Obstetrics                             Anesthesia Physical Anesthesia Plan  ASA: II  Anesthesia Plan: General   Post-op Pain Management:    Induction: Intravenous  PONV Risk Score and Plan: 2 and Propofol infusion  Airway Management Planned: Natural Airway  Additional Equipment:   Intra-op Plan:   Post-operative Plan:   Informed Consent: I have reviewed the patients History and Physical, chart, labs and discussed the procedure including the risks, benefits and alternatives  for the proposed anesthesia with the patient or authorized representative who has indicated his/her understanding and acceptance.   Dental advisory given  Plan Discussed with: CRNA and Anesthesiologist  Anesthesia Plan Comments:         Anesthesia Quick Evaluation

## 2018-09-04 NOTE — H&P (Signed)
Outpatient short stay form Pre-procedure 09/04/2018 10:09 AM Dwayne Sails MD  Primary Physician: Fulton Reek MD  Reason for visit: EGD and colonoscopy  History of present illness: Patient is a 72 year old male presenting today for an EGD and colonoscopy in regards to history of gastroesophageal reflux as well as personal history of adenomatous colon polyps.  He tolerated his prep well.  He does take a 325 mg aspirin daily but has held that for at least 5 days.  He takes no other aspirin products or blood thinning agent.  He does take omeprazole 20 mg a day.  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    Current Facility-Administered Medications:  .  0.9 %  sodium chloride infusion, , Intravenous, Continuous, Dwayne Sails, MD  Medications Prior to Admission  Medication Sig Dispense Refill Last Dose  . aspirin 325 MG tablet Take 325 mg by mouth daily.   Past Month at Unknown time  . finasteride (PROSCAR) 5 MG tablet Take 5 mg by mouth daily.   09/03/2018 at Unknown time  . nabumetone (RELAFEN) 500 MG tablet Take 500 mg by mouth 2 (two) times daily.   09/03/2018 at Unknown time  . simvastatin (ZOCOR) 20 MG tablet Take 20 mg by mouth daily.   09/03/2018 at Unknown time  . terazosin (HYTRIN) 2 MG capsule Take 2 mg by mouth at bedtime.   09/02/2018 at Unknown time  . HYDROcodone-acetaminophen (NORCO) 5-325 MG tablet Take 1 tablet by mouth every 6 (six) hours as needed for moderate pain. (Patient not taking: Reported on 09/04/2018) 15 tablet 0 Completed Course at Unknown time  . magnesium oxide (MAG-OX) 400 MG tablet Take 400 mg by mouth daily.   Not Taking at Unknown time  . traMADol (ULTRAM) 50 MG tablet Take 1 tablet (50 mg total) by mouth every 6 (six) hours as needed for moderate pain. (Patient not taking: Reported on 09/04/2018) 12 tablet 0 Completed Course at Unknown time     No Known  Allergies   Past Medical History:  Diagnosis Date  . Arthritis   . BPH (benign prostatic hyperplasia)   . Cataract cortical, senile   . Hyperglycemia   . PONV (postoperative nausea and vomiting)    shoulder surgery at [facility]  . PVD (peripheral vascular disease) ([place])     Review of systems:      Physical Exam    Heart and lungs: Regular rate and rhythm without rub or gallop, lungs are bilaterally clear.    HEENT: Normocephalic atraumatic eyes are anicteric    Other:    Pertinant exam for procedure: Soft nontender nondistended bowel sounds positive normoactive.    Planned proceedures: EGD, colonoscopy and indicated procedures. I have discussed the risks benefits and complications of procedures to include not limited to bleeding, infection, perforation and the risk of sedation and the patient wishes to proceed.    Dwayne Sails, MD Gastroenterology 09/04/2018  10:09 AM

## 2018-09-04 NOTE — Op Note (Signed)
[facility] Gastroenterology Patient Name: Dwayne Smith Procedure Date: 09/04/2018 10:15 AM MRN: [id number] Account #: [id number] Date of Birth: [birth year] Admit Type: Outpatient Age: 72 Room: [facility] ENDO ROOM 3 Gender: Male Note Status: Finalized Procedure:            Upper GI endoscopy Indications:          Follow-up of gastro-esophageal reflux disease Providers:            Lollie Sails, MD Medicines:            Monitored Anesthesia Care Complications:        No immediate complications. Procedure:            Pre-Anesthesia Assessment:                       - ASA Grade Assessment: III - A patient with severe                        systemic disease.                       After obtaining informed consent, the endoscope was                        passed under direct vision. Throughout the procedure,                        the patient's blood pressure, pulse, and oxygen                        saturations were monitored continuously. The Endoscope                        was introduced through the mouth, and advanced to the                        third part of duodenum. The patient tolerated the                        procedure well. Findings:      The Z-line was variable. Biopsies were taken with a cold forceps for       histology.      A small hiatal hernia was present.      The exam of the esophagus was otherwise normal.      Patchy mild inflammation characterized by congestion (edema) and       erythema was found in the gastric antrum. Biopsies were taken with a       cold forceps for histology.      Biopsies were taken with a cold forceps in the gastric body for       histology.      The cardia and gastric fundus were normal on retroflexion.      The examined duodenum was normal. Impression:           - Z-line variable. Biopsied.                       - Small hiatal hernia.                       - Gastritis. Biopsied.                       -  Normal  examined duodenum.                       - Biopsies were taken with a cold forceps for histology                        in the gastric body. Recommendation:       - Perform a colonoscopy today. Procedure Code(s):    --- Professional ---                       [phone], Esophagogastroduodenoscopy, flexible, transoral;                        with biopsy, single or multiple Diagnosis Code(s):    --- Professional ---                       K22.8, Other specified diseases of esophagus                       K44.9, Diaphragmatic hernia without obstruction or                        gangrene                       K29.70, Gastritis, unspecified, without bleeding                       K21.9, Gastro-esophageal reflux disease without                        esophagitis CPT copyright 2018 American Medical Association. All rights reserved. The codes documented in this report are preliminary and upon coder review may  be revised to meet current compliance requirements. Lollie Sails, MD 09/04/2018 10:36:11 AM This report has been signed electronically. Number of Addenda: 0 Note Initiated On: 09/04/2018 10:15 AM      [facility]

## 2018-09-04 NOTE — Transfer of Care (Signed)
Immediate Anesthesia Transfer of Care Note  Patient: Dwayne Smith  Procedure(s) Performed: COLONOSCOPY WITH PROPOFOL (N/A ) ESOPHAGOGASTRODUODENOSCOPY (EGD) WITH PROPOFOL (N/A )  Patient Location: Endoscopy Unit  Anesthesia Type:General  Level of Consciousness: drowsy  Airway & Oxygen Therapy: Patient Spontanous Breathing  Post-op Assessment: Report given to RN and Post -op Vital signs reviewed and stable  Post vital signs: Reviewed and stable  Last Vitals:  Vitals Value Taken Time  BP 125/81 09/04/2018 11:05 AM  Temp    Pulse 68 09/04/2018 11:06 AM  Resp 16 09/04/2018 11:06 AM  SpO2 97 % 09/04/2018 11:06 AM  Vitals shown include unvalidated device data.  Last Pain:  Vitals:   09/04/18 0959  TempSrc: Oral  PainSc: 0-No pain         Complications: No apparent anesthesia complications

## 2018-09-08 LAB — SURGICAL PATHOLOGY

## 2018-11-30 DIAGNOSIS — I10 Essential (primary) hypertension: Secondary | ICD-10-CM | POA: Insufficient documentation

## 2019-02-04 DIAGNOSIS — D239 Other benign neoplasm of skin, unspecified: Secondary | ICD-10-CM

## 2019-02-04 HISTORY — DX: Other benign neoplasm of skin, unspecified: D23.9

## 2019-11-22 ENCOUNTER — Emergency Department (HOSPITAL_COMMUNITY)
Admission: EM | Admit: 2019-11-22 | Discharge: 2019-11-22 | Disposition: A | Discharge status: Home / Self Care | Payer: Medicare PPO | Attending: Emergency Medicine | Admitting: Emergency Medicine

## 2019-11-22 ENCOUNTER — Other Ambulatory Visit: Payer: Self-pay

## 2019-11-22 DIAGNOSIS — G510 Bell's palsy: Secondary | ICD-10-CM | POA: Insufficient documentation

## 2019-11-22 DIAGNOSIS — Z87891 Personal history of nicotine dependence: Secondary | ICD-10-CM | POA: Insufficient documentation

## 2019-11-22 DIAGNOSIS — Z79899 Other long term (current) drug therapy: Secondary | ICD-10-CM | POA: Diagnosis not present

## 2019-11-22 DIAGNOSIS — R29810 Facial weakness: Secondary | ICD-10-CM | POA: Diagnosis present

## 2019-11-22 DIAGNOSIS — Z7982 Long term (current) use of aspirin: Secondary | ICD-10-CM | POA: Diagnosis not present

## 2019-11-22 MED ORDER — VALACYCLOVIR HCL 500 MG PO TABS
1000.0000 mg | ORAL_TABLET | Freq: Once | ORAL | Status: AC
  Administered 2019-11-22: 1000 mg via ORAL
  Filled 2019-11-22: qty 2

## 2019-11-22 MED ORDER — VALACYCLOVIR HCL 1 G PO TABS: 1000.0000 mg | ORAL_TABLET | Freq: Three times a day (TID) | ORAL | 0 refills | Status: AC

## 2019-11-22 MED ORDER — PREDNISONE 10 MG PO TABS: 60.0000 mg | ORAL_TABLET | Freq: Every day | ORAL | 0 refills | Status: AC

## 2019-11-22 MED ORDER — HYPROMELLOSE (GONIOSCOPIC) 2.5 % OP SOLN: 1.0000 [drp] | Freq: Three times a day (TID) | OPHTHALMIC | 12 refills | Status: DC

## 2019-11-22 MED ORDER — PREDNISONE 20 MG PO TABS
60.0000 mg | ORAL_TABLET | Freq: Once | ORAL | Status: AC
  Administered 2019-11-22: 60 mg via ORAL
  Filled 2019-11-22: qty 3

## 2019-11-22 NOTE — Discharge Instructions (Addendum)
Take the Valtrex and steroids as directed. Follow-up with your eye doctor for your scheduled surgery to inform them of today's visit. Use the artificial tears as directed. Return to the ED if you start to have numbness in arms or legs, headache, blurry vision that is worse than usual, injuries or falls.

## 2019-11-22 NOTE — ED Triage Notes (Signed)
Pt presents to the ED by EMS for Left sided facial droop. Pt reports that he went to bed last night at 1030pm and woke up this am about 7am and felt like he couldn't blink his eye. Pt reports that he woke up at 4 am and felt fine. Pt reports that feb 14th he was helped to move a heavy object and had an "exertion injury" and had blood in the back of his eye at the opthamologist. Pt reports vision problems since then with some improvement over time. No change/worsening of vision today. Pt denies weakness, dizziness, no aphasia, no change in speech, no balance changes. Pt reports 2nd covid vaccine received [id number] with no complications. 18g IV Left AC.

## 2019-11-22 NOTE — ED Provider Notes (Signed)
[facility] EMERGENCY DEPARTMENT Provider Note   CSN: [id number] Arrival date & time: 11/22/19  1625     History Chief Complaint  Patient presents with  . Facial Droop    Dwayne Smith is a 74 y.o. male with a past medical history of arthritis presenting to the ED for facial droop that began when he woke up this morning.  Was in his usual state of health when he went to bed last night.  He felt like he had trouble blinking his left eye when he woke up.  States that a few weeks ago he was helping his son move and had an "injury in my L eye from all the exertion, they said I popped a blood vessel in there."  States that since then he has had visual disturbance including "flecks" in his eye.  This is essentially improved but is scheduled for surgery with his retina specialist in 2 days.  Denies any changes to his vision since his symptoms began this morning.  Denies any numbness in his arms or legs, weakness, injuries or falls, headache, prior CVA, anticoagulant use, changes to speech.  He does endorse cough and rhinorrhea but has had a negative Covid test and received both of his Covid vaccines in the last month.  HPI     Past Medical History:  Diagnosis Date  . Arthritis   . BPH (benign prostatic hyperplasia)   . Cataract cortical, senile   . Hyperglycemia   . PONV (postoperative nausea and vomiting)    shoulder surgery at [facility]  . PVD (peripheral vascular disease) ([place])     There are no problems to display for this patient.   Past Surgical History:  Procedure Laterality Date  . colonoscopy with polypectomy    . COLONOSCOPY WITH PROPOFOL N/A 09/04/2018   Procedure: COLONOSCOPY WITH PROPOFOL;  Surgeon: Lollie Sails, MD;  Location[facility] END[facility];  Service: Endoscopy;  Laterality: N/A;  . ESOPHAGOGASTRODUODENOSCOPY (EGD) WITH PROPOFOL N/A 09/04/2018   Procedure: ESOPHAGOGASTRODUODENOSCOPY (EGD) WITH PROPOFOL;  Surgeon: Lollie Sails, MD;  Location: [facility]  ENDOSCOPY;  Service: Endoscopy;  Laterality: N/A;  . HERNIA REPAIR  [id number]   umbilical  . SHOULDER ARTHROSCOPY WITH ROTATOR CUFF [facility]  . SHOULDER SURGERY Right 1990   bone spur       No family history on file.  Social History   Tobacco Use  . Smoking status: Former Smoker    Quit date: 1976    Years since quitting: 45.1  . Smokeless tobacco: Never Used  Substance Use Topics  . Alcohol use: Yes    Alcohol/week: 7.0 standard drinks    Types: 7 Cans of beer per week  . Drug use: No    Home Medications Prior to Admission medications   Medication Sig Start Date End Date Taking? Authorizing Provider  aspirin 325 MG tablet Take 325 mg by mouth daily.    [provider]  finasteride (PROSCAR) 5 MG tablet Take 5 mg by mouth daily.    [provider]  HYDROcodone-acetaminophen (NORCO) 5-325 MG tablet Take 1 tablet by mouth every 6 (six) hours as needed for moderate pain. Patient not taking: Reported on 09/04/2018 04/17/17   Duanne Guess, PA-C  hydroxypropyl methylcellulose / hypromellose (ISOPTO TEARS / GONIOVISC) 2.5 % ophthalmic solution Place 1 drop into the left eye 3 (three) times daily. 11/22/19   Zakari Couchman, PA-C  magnesium oxide (MAG-OX) 400 MG tablet Take 400 mg  by mouth daily.    [provider]  nabumetone (RELAFEN) 500 MG tablet Take 500 mg by mouth 2 (two) times daily.    [provider]  predniSONE (DELTASONE) 10 MG tablet Take 6 tablets (60 mg total) by mouth daily for 6 days. 11/22/19 11/28/19  Yong Wahlquist, PA-C  simvastatin (ZOCOR) 20 MG tablet Take 20 mg by mouth daily.    [provider]  terazosin (HYTRIN) 2 MG capsule Take 2 mg by mouth at bedtime.    [provider]  traMADol (ULTRAM) 50 MG tablet Take 1 tablet (50 mg total) by mouth every 6 (six) hours as needed for moderate pain. Patient not taking: Reported on 09/04/2018 03/14/16   Sable Feil, PA-C  valACYclovir (VALTREX) 1000 MG tablet  Take 1 tablet (1,000 mg total) by mouth 3 (three) times daily for 7 days. 11/22/19 11/29/19  Delia Heady, PA-C    Allergies    Patient has no known allergies.  Review of Systems   Review of Systems  Constitutional: Negative for appetite change, chills and fever.  HENT: Negative for ear pain, rhinorrhea, sneezing and sore throat.   Eyes: Negative for photophobia and visual disturbance.  Respiratory: Negative for cough, chest tightness, shortness of breath and wheezing.   Cardiovascular: Negative for chest pain and palpitations.  Gastrointestinal: Negative for abdominal pain, blood in stool, constipation, diarrhea, nausea and vomiting.  Genitourinary: Negative for dysuria, hematuria and urgency.  Musculoskeletal: Negative for myalgias.  Skin: Negative for rash.  Neurological: Positive for facial asymmetry. Negative for dizziness, weakness and light-headedness.    Physical Exam Updated Vital Signs BP (!) 175/97   Pulse 74   Temp 98.4 F (36.9 C) (Oral)   Resp 15   SpO2 98%   Physical Exam Vitals and nursing note reviewed.  Constitutional:      General: He is not in acute distress.    Appearance: He is well-developed.  HENT:     Head: Normocephalic and atraumatic.     Nose: Nose normal.  Eyes:     General: No scleral icterus.       Left eye: No discharge.     Conjunctiva/sclera: Conjunctivae normal.  Cardiovascular:     Rate and Rhythm: Normal rate and regular rhythm.     Heart sounds: Normal heart sounds. No murmur. No friction rub. No gallop.   Pulmonary:     Effort: Pulmonary effort is normal. No respiratory distress.     Breath sounds: Normal breath sounds.  Abdominal:     General: Bowel sounds are normal. There is no distension.     Palpations: Abdomen is soft.     Tenderness: There is no abdominal tenderness. There is no guarding.  Musculoskeletal:        General: Normal range of motion.     Cervical back: Normal range of motion and neck supple.  Skin:     General: Skin is warm and dry.     Findings: No rash.  Neurological:     Mental Status: He is alert and oriented to person, place, and time.     Sensory: No sensory deficit.     Motor: No weakness or abnormal muscle tone.     Coordination: Coordination normal.     Comments: L sided facial droop noted with inability to blink L eye. Eyebrows are asymmetrical.  Unable to furrow or raise eyebrows on left side.  Asymmetrical smile.  Sensation intact to light touch of face, bilateral upper and lower  extremities.  Strength 5/5 in bilateral upper and lower extremities.     ED Results / Procedures / Treatments   Labs (all labs ordered are listed, but only abnormal results are displayed) Labs Reviewed - No data to display  EKG EKG Interpretation  Date/Time:  Monday November 22 2019 16:30:50 EST Ventricular Rate:  79 PR Interval:    QRS Duration: 92 QT Interval:  376 QTC Calculation: 431 R Axis:   7 Text Interpretation: Sinus rhythm No prior ECG for comparison. No STEMI Confirmed by Antony Blackbird [phone]) on 11/22/2019 6:49:18 PM   Radiology No results found.  Procedures Procedures (including critical care time)  Medications Ordered in ED Medications  predniSONE (DELTASONE) tablet 60 mg (60 mg Oral Given 11/22/19 1838)  valACYclovir (VALTREX) tablet 1,000 mg (1,000 mg Oral Given 11/22/19 1839)    ED Course  I have reviewed the triage vital signs and the nursing notes.  Pertinent labs & imaging results that were available during my care of the patient were reviewed by me and considered in my medical decision making (see chart for details).    MDM Rules/Calculators/A&P                      73 year old male with a past medical history of arthritis presenting to the ED for facial droop that began when he woke up this morning.  Was in his usual state of health when he went to bed last night.  Reports approximately 2 to 3 weeks ago had what appears to be additional injury in his eye.  He has  been having "flaccid" in his left eye since then which has gradually improved but did not worsen today.  He is scheduled for surgery with a retina specialist in 2 days.  Felt like he had trouble blinking his left eye as well as difference in his smile.  Denies any weakness, numbness, injuries or falls, headache.  On exam left side of face is asymmetrical, unable to raise eyebrows, unable to blink.  Mouth drooping on the left side.  There is equal grip strength in bilateral upper extremities.  Normal strength noted to lower extremities.  He remains ambulatory.  Sensation intact to light touch of face.  Suspect that his symptoms are due to Bell's palsy due to presentation today.  I doubt CVA as the eyebrows are affected and he has no other neurological symptoms.  He will be discharged with antivirals, steroids and artificial tears.  I did tell him to contact his ophthalmologist regarding his surgery and willingness to proceed.  Patient is agreeable to the plan.  Given strict return precautions.  Patient is hemodynamically stable, in NAD, and able to ambulate in the ED. Evaluation does not show pathology that would require ongoing emergent intervention or inpatient treatment. I have personally reviewed and interpreted all lab work and imaging at today's ED visit. I explained the diagnosis to the patient. Pain has been managed and has no complaints prior to discharge. Patient is comfortable with above plan and is stable for discharge at this time. All questions were answered prior to disposition. Strict return precautions for returning to the ED were discussed. Encouraged follow up with PCP.   An After Visit Summary was printed and given to the patient.   Portions of this note were generated with [employer]. Dictation errors may occur despite best attempts at proofreading.  Final Clinical Impression(s) / ED Diagnoses Final diagnoses:  Left-sided Bell's palsy  Rx / DC Orders ED Discharge  Orders         Ordered    predniSONE (DELTASONE) 10 MG tablet  Daily     11/22/19 2002    valACYclovir (VALTREX) 1000 MG tablet  3 times daily     11/22/19 2002    hydroxypropyl methylcellulose / hypromellose (ISOPTO TEARS / GONIOVISC) 2.5 % ophthalmic solution  3 times daily     11/22/19 2002           Delia Heady, [place] 11/22/19 2005    Tegeler, Gwenyth Allegra, MD 11/23/19 [phone]

## 2019-11-22 NOTE — ED Notes (Signed)
Pt speaking to son on the phone

## 2019-12-07 DIAGNOSIS — Z8669 Personal history of other diseases of the nervous system and sense organs: Secondary | ICD-10-CM | POA: Insufficient documentation

## 2020-10-23 ENCOUNTER — Encounter: Payer: TRICARE For Life (TFL) | Admitting: Dermatology

## 2020-10-24 ENCOUNTER — Other Ambulatory Visit: Payer: Self-pay | Admitting: Neurology

## 2020-10-24 DIAGNOSIS — I639 Cerebral infarction, unspecified: Secondary | ICD-10-CM

## 2020-10-24 DIAGNOSIS — R531 Weakness: Secondary | ICD-10-CM | POA: Insufficient documentation

## 2020-10-24 DIAGNOSIS — M545 Low back pain, unspecified: Secondary | ICD-10-CM | POA: Insufficient documentation

## 2020-10-24 DIAGNOSIS — R202 Paresthesia of skin: Secondary | ICD-10-CM | POA: Insufficient documentation

## 2020-10-24 DIAGNOSIS — R200 Anesthesia of skin: Secondary | ICD-10-CM | POA: Insufficient documentation

## 2020-11-14 ENCOUNTER — Ambulatory Visit
Admission: RE | Admit: 2020-11-14 | Discharge: 2020-11-14 | Disposition: A | Discharge status: Home / Self Care | Payer: Medicare PPO | Source: Ambulatory Visit | Attending: Neurology | Admitting: Neurology

## 2020-11-14 ENCOUNTER — Other Ambulatory Visit: Payer: Self-pay

## 2020-11-14 DIAGNOSIS — I639 Cerebral infarction, unspecified: Secondary | ICD-10-CM

## 2020-11-14 MED ORDER — GADOBENATE DIMEGLUMINE 529 MG/ML IV SOLN
16.0000 mL | Freq: Once | INTRAVENOUS | Status: AC | PRN
  Administered 2020-11-14: 16 mL via INTRAVENOUS

## 2020-11-28 ENCOUNTER — Other Ambulatory Visit: Payer: Self-pay | Admitting: Physical Medicine & Rehabilitation

## 2020-11-28 DIAGNOSIS — G8929 Other chronic pain: Secondary | ICD-10-CM

## 2020-12-11 ENCOUNTER — Other Ambulatory Visit: Payer: Self-pay

## 2020-12-11 ENCOUNTER — Ambulatory Visit (INDEPENDENT_AMBULATORY_CARE_PROVIDER_SITE_OTHER): Payer: Medicare PPO | Admitting: Dermatology

## 2020-12-11 DIAGNOSIS — Z86018 Personal history of other benign neoplasm: Secondary | ICD-10-CM | POA: Diagnosis not present

## 2020-12-11 DIAGNOSIS — L219 Seborrheic dermatitis, unspecified: Secondary | ICD-10-CM

## 2020-12-11 DIAGNOSIS — D692 Other nonthrombocytopenic purpura: Secondary | ICD-10-CM

## 2020-12-11 DIAGNOSIS — L570 Actinic keratosis: Secondary | ICD-10-CM

## 2020-12-11 DIAGNOSIS — L820 Inflamed seborrheic keratosis: Secondary | ICD-10-CM

## 2020-12-11 DIAGNOSIS — L821 Other seborrheic keratosis: Secondary | ICD-10-CM

## 2020-12-11 DIAGNOSIS — L578 Other skin changes due to chronic exposure to nonionizing radiation: Secondary | ICD-10-CM

## 2020-12-11 DIAGNOSIS — Z1283 Encounter for screening for malignant neoplasm of skin: Secondary | ICD-10-CM | POA: Diagnosis not present

## 2020-12-11 DIAGNOSIS — D1800 Hemangioma unspecified site: Secondary | ICD-10-CM

## 2020-12-11 DIAGNOSIS — L814 Other melanin hyperpigmentation: Secondary | ICD-10-CM

## 2020-12-11 MED ORDER — KETOCONAZOLE 2 % EX CREA: 1.0000 "application " | TOPICAL_CREAM | Freq: Every day | CUTANEOUS | 5 refills | Status: AC

## 2020-12-11 NOTE — Progress Notes (Signed)
Follow-Up Visit   Subjective  Dwayne Smith is a 75 y.o. male who presents for the following: TBSE (Patient here for full body skin exam and skin cancer screening. Patient with hx of dysplastic nevus. He does have some itchy spots at the scalp and a spot at left side that has gotten rough. ). The patient presents for Total-Body Skin Exam (TBSE) for skin cancer screening and mole check.  The following portions of the chart were reviewed this encounter and updated as appropriate:   Tobacco  Allergies  Meds  Problems  Med Hx  Surg Hx  Fam Hx     Review of Systems:  No other skin or systemic complaints except as noted in HPI or Assessment and Plan.  Objective  Well appearing patient in no apparent distress; mood and affect are within normal limits.  A full examination was performed including scalp, head, eyes, ears, nose, lips, neck, chest, axillae, abdomen, back, buttocks, bilateral upper extremities, bilateral lower extremities, hands, feet, fingers, toes, fingernails, and toenails. All findings within normal limits unless otherwise noted below.  Objective  Left chest, left arm (4): Erythematous keratotic or waxy stuck-on papule or plaque.   Objective  Scalp x 23, right ear x 1, right temple x 1 (25): Erythematous thin papules/macules with gritty scale.   Objective  Chest: Pink scaliness   Assessment & Plan  Inflamed seborrheic keratosis (4) Left chest, left arm  Destruction of lesion - Left chest, left arm Complexity: simple   Destruction method: cryotherapy   Informed consent: discussed and consent obtained   Timeout:  patient name, date of birth, surgical site, and procedure verified Lesion destroyed using liquid nitrogen: Yes   Region frozen until ice ball extended beyond lesion: Yes   Outcome: patient tolerated procedure well with no complications   Post-procedure details: wound care instructions given    AK (actinic keratosis) (25) Scalp x 23, right ear x 1,  right temple x 1  Destruction of lesion - Scalp x 23, right ear x 1, right temple x 1 Complexity: simple   Destruction method: cryotherapy   Informed consent: discussed and consent obtained   Timeout:  patient name, date of birth, surgical site, and procedure verified Lesion destroyed using liquid nitrogen: Yes   Region frozen until ice ball extended beyond lesion: Yes   Outcome: patient tolerated procedure well with no complications   Post-procedure details: wound care instructions given    Seborrheic dermatitis Chest Start ketoconazole 2% cream to chest at bedtime.  Seborrheic Dermatitis  -  is a chronic persistent rash characterized by pinkness and scaling most commonly of the mid face but also can occur on the scalp (dandruff), ears; mid chest and mid back. It tends to be exacerbated by stress and cooler weather.  People who have neurologic disease may experience new onset or exacerbation of existing seborrheic dermatitis.  The condition is not curable but treatable and can be controlled.  Ordered Medications: ketoconazole (NIZORAL) 2 % cream   Lentigines - Scattered tan macules - Due to sun exposure - Benign-appering, observe - Recommend daily broad spectrum sunscreen SPF 30+ to sun-exposed areas, reapply every 2 hours as needed. - Call for any changes  Seborrheic Keratoses - Stuck-on, waxy, tan-brown papules and plaques  - Discussed benign etiology and prognosis. - Observe - Call for any changes  Melanocytic Nevi - Tan-brown and/or pink-flesh-colored symmetric macules and papules - Benign appearing on exam today - Observation - Call clinic for new or  changing moles - Recommend daily use of broad spectrum spf 30+ sunscreen to sun-exposed areas.   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  The purpose is to decrease the number of clinically evident and subclinical PreCancerous lesions to prevent progression to development of skin cancer by chemically destroying early precancer changes that may or may not be visible.  It has been shown to reduce the risk of developing skin cancer in the treated area. As a result of treatment, redness, scaling, crusting, and open sores may occur during treatment course. One or more than one of these methods may be used and may have to be used several times to control, suppress and eliminate the PreCancerous changes. Discussed treatment course, expected reaction, and possible side effects.  Purpura - Chronic; persistent and recurrent.  Treatable, but not curable. - Violaceous macules and patches, severe - Benign - Related to trauma, age, sun damage and/or use of blood thinners, chronic use of topical and/or oral steroids - Observe - Can use OTC arnica containing moisturizer such as Dermend Bruise Formula if desired - Call for worsening or other concerns  History of Dysplastic Nevi - No evidence of recurrence today at left low back 6.0cm lat to spine - Recommend regular full body skin exams - Recommend daily broad spectrum sunscreen SPF 30+ to sun-exposed areas, reapply every 2 hours as needed.  - Call if any new or changing lesions are noted between office visits  Skin cancer screening performed today.  Return in about 4 weeks (around 01/08/2021) for PDT to scalp, 3 months AK follow up.  Graciella Belton, RMA, am acting as scribe for Sarina Ser, MD . Documentation: I have  reviewed the above documentation for accuracy and completeness, and I agree with the above.  Sarina Ser, MD

## 2020-12-11 NOTE — Patient Instructions (Signed)
If you have any questions or concerns for your doctor, please call our main line at [phone] and press option 4 to reach your doctor's medical assistant. If no one answers, please leave a voicemail as directed and we will return your call as soon as possible. Messages left after 4 pm will be answered the following business day.   You may also send [place] a message via [place]. We typically respond to MyChart messages within 1-2 business days.  For prescription refills, please ask your pharmacy to contact our office. Our fax number is [phone].  If you have an urgent issue when the clinic is closed that cannot wait until the next business day, you can page your doctor at the number below.    Please note that while we do our best to be available for urgent issues outside of office hours, we are not available 24/7.   If you have an urgent issue and are unable to reach [place], you may choose to seek medical care at your doctor's office, retail clinic, urgent care center, or emergency room.  If you have a medical emergency, please immediately call 911 or go to the emergency department.  Pager Numbers  - Dr. Nehemiah Massed: [phone]  - Dr. Laurence Ferrari: [phone]  - Dr. Nicole Kindred: [phone]  In the event of inclement weather, please call our main line at [phone] for an update on the status of any delays or closures.  Dermatology Medication Tips: Please keep the boxes that topical medications come in in order to help keep track of the instructions about where and how to use these. Pharmacies typically print the medication instructions only on the boxes and not directly on the medication tubes.   If your medication is too expensive, please contact our office at [phone] option 4 or send [place] a message through [place] [facility].   We are unable to tell what your co-pay for medications will be in advance as this is different depending on your insurance coverage. However, we may be able to find a substitute  medication at lower cost or fill out paperwork to get insurance to cover a needed medication.   If a prior authorization is required to get your medication covered by your insurance company, please allow [place] 1-2 business days to complete this process.  Drug prices often vary depending on where the prescription is filled and some pharmacies may offer cheaper prices.  The website www.goodrx.com contains coupons for medications through different pharmacies. The prices here do not account for what the cost may be with help from insurance (it may be cheaper with your insurance), but the website can give you the price if you did not use any insurance.  - You can print the associated coupon and take it with your prescription to the pharmacy.  - You may also stop by our office during regular business hours and pick up a GoodRx coupon card.  - If you need your prescription sent electronically to a different pharmacy, notify our office through [facility] or by phone at [phone] option 4.   Melanoma ABCDEs  Melanoma is the most dangerous type of skin cancer, and is the leading cause of death from skin disease.  You are more likely to develop melanoma if you:  Have light-colored skin, light-colored eyes, or red or blond hair  Spend a lot of time in the sun  Tan regularly, either outdoors or in a tanning bed  Have had blistering sunburns, especially during childhood  Have a close  family member who has had a melanoma  Have atypical moles or large birthmarks  Early detection of melanoma is key since treatment is typically straightforward and cure rates are extremely high if we catch it early.   The first sign of melanoma is often a change in a mole or a new dark spot.  The ABCDE system is a way of remembering the signs of melanoma.  A for asymmetry:  The two halves do not match. B for border:  The edges of the growth are irregular. C for color:  A mixture of colors are present instead  of an even brown color. D for diameter:  Melanomas are usually (but not always) greater than 29mm - the size of a pencil eraser. E for evolution:  The spot keeps changing in size, shape, and color.  Please check your skin once per month between visits. You can use a small mirror in front and a large mirror behind you to keep an eye on the back side or your body.   If you see any new or changing lesions before your next follow-up, please call to schedule a visit.  Please continue daily skin protection including broad spectrum sunscreen SPF 30+ to sun-exposed areas, reapplying every 2 hours as needed when you're outdoors.    Cryotherapy Aftercare  . Wash gently with soap and water everyday.   Marland Kitchen Apply Vaseline and Band-Aid daily until healed.

## 2020-12-12 ENCOUNTER — Encounter: Payer: Self-pay | Admitting: Dermatology

## 2020-12-17 ENCOUNTER — Ambulatory Visit
Admission: RE | Admit: 2020-12-17 | Discharge: 2020-12-17 | Disposition: A | Discharge status: Home / Self Care | Payer: Medicare PPO | Source: Ambulatory Visit | Attending: Physical Medicine & Rehabilitation | Admitting: Physical Medicine & Rehabilitation

## 2020-12-17 ENCOUNTER — Other Ambulatory Visit: Payer: Self-pay

## 2020-12-17 DIAGNOSIS — M5441 Lumbago with sciatica, right side: Secondary | ICD-10-CM

## 2020-12-17 DIAGNOSIS — G8929 Other chronic pain: Secondary | ICD-10-CM

## 2020-12-19 ENCOUNTER — Other Ambulatory Visit (INDEPENDENT_AMBULATORY_CARE_PROVIDER_SITE_OTHER): Payer: Self-pay | Admitting: Vascular Surgery

## 2020-12-19 DIAGNOSIS — I739 Peripheral vascular disease, unspecified: Secondary | ICD-10-CM

## 2020-12-21 ENCOUNTER — Ambulatory Visit (INDEPENDENT_AMBULATORY_CARE_PROVIDER_SITE_OTHER): Payer: Medicare PPO

## 2020-12-21 ENCOUNTER — Other Ambulatory Visit: Payer: Self-pay

## 2020-12-21 ENCOUNTER — Encounter (INDEPENDENT_AMBULATORY_CARE_PROVIDER_SITE_OTHER): Payer: Self-pay | Admitting: Vascular Surgery

## 2020-12-21 ENCOUNTER — Ambulatory Visit (INDEPENDENT_AMBULATORY_CARE_PROVIDER_SITE_OTHER): Payer: Medicare PPO | Admitting: Vascular Surgery

## 2020-12-21 VITALS — BP 181/94 | HR 72 | Resp 17 | Ht 67.0 in | Wt 174.0 lb

## 2020-12-21 DIAGNOSIS — I739 Peripheral vascular disease, unspecified: Secondary | ICD-10-CM | POA: Diagnosis not present

## 2020-12-21 DIAGNOSIS — M1990 Unspecified osteoarthritis, unspecified site: Secondary | ICD-10-CM | POA: Insufficient documentation

## 2020-12-21 DIAGNOSIS — R739 Hyperglycemia, unspecified: Secondary | ICD-10-CM | POA: Insufficient documentation

## 2020-12-21 DIAGNOSIS — M79606 Pain in leg, unspecified: Secondary | ICD-10-CM

## 2020-12-21 DIAGNOSIS — L259 Unspecified contact dermatitis, unspecified cause: Secondary | ICD-10-CM | POA: Insufficient documentation

## 2020-12-21 DIAGNOSIS — M25819 Other specified joint disorders, unspecified shoulder: Secondary | ICD-10-CM | POA: Insufficient documentation

## 2020-12-21 DIAGNOSIS — M75120 Complete rotator cuff tear or rupture of unspecified shoulder, not specified as traumatic: Secondary | ICD-10-CM | POA: Insufficient documentation

## 2020-12-21 DIAGNOSIS — K635 Polyp of colon: Secondary | ICD-10-CM | POA: Insufficient documentation

## 2020-12-21 DIAGNOSIS — M4712 Other spondylosis with myelopathy, cervical region: Secondary | ICD-10-CM | POA: Diagnosis not present

## 2020-12-21 DIAGNOSIS — N138 Other obstructive and reflux uropathy: Secondary | ICD-10-CM | POA: Insufficient documentation

## 2020-12-21 DIAGNOSIS — H25019 Cortical age-related cataract, unspecified eye: Secondary | ICD-10-CM | POA: Insufficient documentation

## 2020-12-21 DIAGNOSIS — E785 Hyperlipidemia, unspecified: Secondary | ICD-10-CM | POA: Insufficient documentation

## 2020-12-21 DIAGNOSIS — E782 Mixed hyperlipidemia: Secondary | ICD-10-CM | POA: Diagnosis not present

## 2020-12-21 DIAGNOSIS — M47812 Spondylosis without myelopathy or radiculopathy, cervical region: Secondary | ICD-10-CM | POA: Insufficient documentation

## 2020-12-21 DIAGNOSIS — K429 Umbilical hernia without obstruction or gangrene: Secondary | ICD-10-CM | POA: Insufficient documentation

## 2020-12-21 DIAGNOSIS — M79603 Pain in arm, unspecified: Secondary | ICD-10-CM

## 2020-12-21 NOTE — Progress Notes (Signed)
MRN : [id number]  Dwayne Smith is a 75 y.o. (07/13/1946) male who presents with chief complaint of  Chief Complaint  Patient presents with  . New Patient (Initial Visit)    ultrasound  .  History of Present Illness:   The patient is seen for evaluation of painful lower extremities. Patient notes the pain is variable and not always associated with activity.  The pain is somewhat consistent day to day occurring on most days. The patient notes the pain also occurs with standing and routinely seems worse as the day wears on. The pain has been progressive over the past several years.   He describes a dramatic increase in his symptoms particularly arm symptoms this past Christmas when reaching up into a kitchen cabinet.  He felt a profound electrical shock shoot down his arm right much more so than left but both were affected.  Since then he has had increasing problems with his arms as well his legs.  He describes his right foot "just does not want to seem to move "when he walks.  There is a  history of back problems and DJD of the lumbar and sacral spine.  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  The patient states these symptoms are causing  a profound negative impact on quality of life and daily activities.  He has a remote history of a vascular injury while he was in the [employer] attached to a [employer] unit.  It was associated with some trauma at that time.  He was told he has a blockage and was treated with aspirin and cholesterol medication.  He was told to continue to walk and that this would recruit extra blood vessels around the blockage.  Interestingly, what ever symptoms he was having resolved and he has not been troubled with claudication type symptoms of the left for many years.  The patient denies rest pain or dangling of an extremity off the side of the bed during the night for relief. No open wounds or  sores at this time. No history of DVT or phlebitis. No prior interventions or surgeries.  ABIs performed today in the office demonstrate right equals 1.39 and left equals 1.06 with triphasic Doppler signals of both the posterior tibial and anterior tibial arteries bilaterally   Current Meds  Medication Sig  . aspirin 325 MG tablet Take 325 mg by mouth daily.  . finasteride (PROSCAR) 5 MG tablet Take 5 mg by mouth daily.  Marland Kitchen gabapentin (NEURONTIN) 100 MG capsule   . ketoconazole (NIZORAL) 2 % cream Apply 1 application topically daily. To chest at bedtime  . nabumetone (RELAFEN) 500 MG tablet Take 500 mg by mouth 2 (two) times daily.  Marland Kitchen omeprazole (PRILOSEC) 20 MG capsule   . simvastatin (ZOCOR) 20 MG tablet Take 20 mg by mouth daily.  Marland Kitchen terazosin (HYTRIN) 2 MG capsule Take 2 mg by mouth at bedtime.    Past Medical History:  Diagnosis Date  . Arthritis   . BPH (benign prostatic hyperplasia)   . Cataract cortical, senile   . Dysplastic nevus 02/04/2019   left low back 6.0 cm lat to spine, moderate atypia   . Hyperglycemia   . PONV (postoperative nausea and vomiting)    shoulder surgery at [facility]  . PVD (peripheral vascular disease) ([place])     Past Surgical History:  Procedure Laterality Date  . colonoscopy with polypectomy    .  COLONOSCOPY WITH PROPOFOL N/A 09/04/2018   Procedure: COLONOSCOPY WITH PROPOFOL;  Surgeon: Lollie Sails, MD;  Location: [facility] ENDOSCOPY;  Service: Endoscopy;  Laterality: N/A;  . ESOPHAGOGASTRODUODENOSCOPY (EGD) WITH PROPOFOL N/A 09/04/2018   Procedure: ESOPHAGOGASTRODUODENOSCOPY (EGD) WITH PROPOFOL;  Surgeon: Lollie Sails, MD;  Location: [facility] ENDOSCOPY;  Service: Endoscopy;  Laterality: N/A;  . HERNIA REPAIR  [id number]   umbilical  . SHOULDER ARTHROSCOPY WITH ROTATOR CUFF [facility]  . SHOULDER SURGERY Right 1990   bone spur    Social History Social History   Tobacco Use  . Smoking status: Former Smoker    Quit date: 1976    Years  since quitting: 46.2  . Smokeless tobacco: Never Used  Substance Use Topics  . Alcohol use: Yes    Alcohol/week: 7.0 standard drinks    Types: 7 Cans of beer per week  . Drug use: No    Family History No family history of bleeding/clotting disorders, porphyria or autoimmune disease   Allergies  Allergen Reactions  . No Known Allergies Anxiety     REVIEW OF SYSTEMS (Negative unless checked)  Constitutional: [] Weight loss  [] Fever  [] Chills Cardiac: [] Chest pain   [] Chest pressure   [] Palpitations   [] Shortness of breath when laying flat   [] Shortness of breath with exertion. Vascular:  [x] Pain in legs with walking   [x] Pain in legs at rest  [] History of DVT   [] Phlebitis   [] Swelling in legs   [] Varicose veins   [] Non-healing ulcers Pulmonary:   [] Uses home oxygen   [] Productive cough   [] Hemoptysis   [] Wheeze  [] COPD   [] Asthma Neurologic:  [] Dizziness   [] Seizures   [] History of stroke   [] History of TIA  [] Aphasia   [] Vissual changes   [] Weakness or numbness in arm   [] Weakness or numbness in leg Musculoskeletal:   [] Joint swelling   [] Joint pain   [] Low back pain Hematologic:  [] Easy bruising  [] Easy bleeding   [] Hypercoagulable state   [] Anemic Gastrointestinal:  [] Diarrhea   [] Vomiting  [x] Gastroesophageal reflux/heartburn   [] Difficulty swallowing. Genitourinary:  [] Chronic kidney disease   [] Difficult urination  [] Frequent urination   [] Blood in urine Skin:  [] Rashes   [] Ulcers  Psychological:  [] History of anxiety   []  History of major depression.  Physical Examination  Vitals:   12/21/20 1016  BP: (!) 181/94  Pulse: 72  Resp: 17  Weight: 174 lb (78.9 kg)  Height: 5\' 7"  (1.702 m)   Body mass index is 27.25 kg/m. Gen: WD/WN, NAD his gait is notable for right foot drop Head: [place]/AT, No temporalis wasting.  Ear/Nose/Throat: Hearing grossly intact, nares w/o erythema or drainage, poor dentition Eyes: PER, EOMI, sclera nonicteric.  Neck: Supple, no masses.  No bruit  or JVD.  Pulmonary:  Good air movement, clear to auscultation bilaterally, no use of accessory muscles.  Cardiac: RRR, normal S1, S2, no Murmurs. Vascular: Vessel Right Left  Radial Palpable Palpable  PT Palpable Palpable  DP Palpable Palpable  Gastrointestinal: soft, non-distended. No guarding/no peritoneal signs.  Musculoskeletal: M/S 5/5 throughout left side right side is notable for weakness of his grip as well as poor dorsiflexion and plantar flexion of his foot.  Atrophy of the right hand is noted.  Neurologic: CN 2-12 intact.   Speech is fluent. Motor exam as listed above. Psychiatric: Judgment intact, Mood & affect appropriate for pt's clinical situation. Dermatologic: No rashes or ulcers noted.  No changes consistent with cellulitis.  CBC Lab Results  Component Value Date   WBC 4.0 09/04/2018   HGB 15.6 09/04/2018   HCT 45.8 09/04/2018   MCV 86.4 09/04/2018   PLT 138 (L) 09/04/2018    BMET No results found for: NA, K, CL, CO2, GLUCOSE, BUN, CREATININE, CALCIUM, GFRNONAA, GFRAA CrCl cannot be calculated (No successful lab value found.).  COAG No results found for: INR, PROTIME  Radiology MR CERVICAL SPINE WO CONTRAST  Result Date: 12/18/2020 CLINICAL DATA:  Pain and numbness right arm, bilateral arm weakness EXAM: MRI CERVICAL SPINE WITHOUT CONTRAST TECHNIQUE: Multiplanar, multisequence MR imaging of the cervical spine was performed. No intravenous contrast was administered. COMPARISON:  None. FINDINGS: Alignment: Mild retrolisthesis at C3-C4, C4-C5, and C5-C6. Mild anterolisthesis at C7-T1. Vertebrae: Multilevel degenerative endplate irregularity with mild degenerative endplate marrow changes. Focus of marked low signal intensity at the inferior endplate of C6 may reflect a bone island. No suspicious osseous lesion. Cord: Abnormal cord T2 hyperintensity at C6-T1 primarily involving central gray matter. Posterior Fossa, vertebral arteries, paraspinal tissues: Chronic small  left cerebellar infarct. Otherwise unremarkable. Disc levels: C2-C3:  No canal or foraminal stenosis. C3-C4: Disc bulge with endplate osteophytes. Uncovertebral and facet hypertrophy. Ligamentum flavum thickening. Moderate to marked canal stenosis. Mild to moderate foraminal stenosis. C4-C5: Disc bulge with endplate osteophytes. Uncovertebral and facet hypertrophy. Ligamentum flavum thickening. Marked canal stenosis. Mild foraminal stenosis. C5-C6: Disc bulge with endplate osteophytes. Uncovertebral and facet hypertrophy. Ligamentum flavum thickening. Marked canal stenosis. Mild right and moderate to marked left foraminal stenosis. C6-C7: Disc bulge with endplate osteophytes. Uncovertebral and facet hypertrophy. Ligamentum flavum thickening. Marked canal stenosis with cord compression. Moderate to marked right and marked left foraminal stenosis. C7-T1: Left (marked) much greater than right facet hypertrophy. No canal or right foraminal stenosis. Mild left foraminal stenosis. IMPRESSION: Multilevel degenerative changes as detailed above. There is significant canal narrowing from C3-C4 through C6-C7, greatest at C6-C7. Abnormal cord signal is present from C6-T1 reflecting edema or myelomalacia. Foraminal narrowing is greatest at C5-C6 and C6-C7. These results will be called to the ordering clinician or representative by the Radiologist Assistant, and communication documented in the PACS or [employer]. Electronically Signed   By: Macy Mis M.D.   On: 12/18/2020 08:10   MR LUMBAR SPINE WO CONTRAST  Result Date: 12/18/2020 CLINICAL DATA:  Chronic bilateral low back pain with right-sided sciatica. Additional history provided by technologist: Patient reports right leg pain and weakness, symptoms for 3 months. EXAM: MRI LUMBAR SPINE WITHOUT CONTRAST TECHNIQUE: Multiplanar, multisequence MR imaging of the lumbar spine was performed. No intravenous contrast was administered. COMPARISON:  MRI of the lumbar spine  07/21/2010. FINDINGS: Segmentation: For the purposes of this dictation, five lumbar vertebrae are assumed and the caudal most well-formed intervertebral disc is designated L5-S1. Alignment: Straightening of the expected lumbar lordosis. 2 mm L4-L5 grade 1 anterolisthesis. Vertebrae: Vertebral body height is maintained. Small Schmorl node within the L4 superior endplate. No significant marrow edema or focal suspicious osseous lesion. Multilevel vertebral body hemangiomas. Conus medullaris and cauda equina: Conus extends to the L2 level. No signal abnormality within the visualized distal spinal cord. Paraspinal and other soft tissues: Right renal cysts. Paraspinal soft tissues within normal limits Disc levels: Unless otherwise stated, the level by level findings below have not significantly changed since prior MRI 07/21/2010. Mild congenital narrowing of the lumbar spinal canal. Progressive mild multilevel disc degeneration, greatest at L4-L5. T12-L1: Minimal facet arthrosis. No significant disc herniation or stenosis. L1-L2: Mild facet arthrosis.  No significant disc herniation or stenosis. L2-L3: Small disc bulge, new from the prior exam. Minimal facet arthrosis. No significant spinal canal or foraminal narrowing. L3-L4: Central posterior annular fissure, new from the prior exam. Disc bulge and endplate spurring, slightly progressed. A previously demonstrated left foraminal/extraforaminal disc protrusion is no longer appreciated. Progressive moderate facet arthrosis with ligamentum flavum hypertrophy. Progressive mild left subarticular narrowing without nerve root impingement. Central canal patent. There are now small bilateral facet joint effusions. Mild bilateral neural foraminal narrowing (greater on the right). L4-L5: Grade 1 anterolisthesis. Disc uncovering with disc bulge. Moderate facet arthrosis with ligamentum flavum hypertrophy. Progressive moderate left greater than right subarticular stenosis with  potential to affect either descending L5 nerve root. Progressive moderate central canal stenosis. Mild bilateral neural foraminal narrowing, not significantly changed. L5-S1: Minimal disc bulge. Mild facet arthrosis. Trace bilateral facet joint effusions, new from the prior exam. No significant spinal canal stenosis or neural foraminal narrowing. IMPRESSION: Lumbar spondylosis as outlined and having progressed at several levels since the prior MRI of 07/21/2010. Findings are most notably as follows. At L4-L5, there is progressive mild disc degeneration. Redemonstrated grade 1 anterolisthesis. Disc uncovering with disc bulge. Moderate facet arthrosis with ligamentum flavum hypertrophy. Progressive moderate left greater than right subarticular stenosis with potential to affect either descending L5 nerve root. Progressive moderate central canal stenosis. Mild bilateral neural foraminal narrowing, unchanged. At L3-L4, a central posterior annular fissure is new from the prior exam. Disc bulge and endplate spurring, slightly progressed. A previously demonstrated left foraminal/extraforaminal disc protrusion is no longer appreciated. Progressive moderate facet arthrosis with ligamentum flavum hypertrophy. Progressive mild left subarticular narrowing without nerve root impingement. Small bilateral facet joint effusions are new. Mild bilateral neural foraminal narrowing. At L5-S1, redemonstrated mild disc bulge and mild facet arthrosis. Trace bilateral facet joint effusions are new from the prior exam. No significant canal or foraminal stenosis. Electronically Signed   By: Kellie Simmering DO   On: 12/18/2020 08:18    Assessment/Plan 1. Arm and leg pain Recommend:  I do not find evidence of Vascular pathology that would explain the patient's symptoms  The patient has atypical pain symptoms for vascular disease.  His MRI demonstrates significant issues which I believe are the root cause of his symptoms  I do not find  evidence of Vascular pathology that would explain the patient's symptoms and I suspect the patient is c/o pseudoclaudication.  Patient should have an evaluation of his LS spine which I defer to neurosurgery.  I have placed an ambulatory consult for Dr. Cari Caraway  Noninvasive studies of the legs do not identify vascular problems  The patient should continue walking and begin a more formal exercise program. The patient should continue his antiplatelet therapy and aggressive treatment of the lipid abnormalities. The patient should begin wearing graduated compression socks 15-20 mmHg strength to control her mild edema.  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 Peripheral vascular disease ([place]) Recommend:  I do not find evidence of life style limiting vascular disease. The patient specifically denies life style limitation.  Previous noninvasive studies including ABI's of the legs do not identify critical vascular problems.  The patient should continue walking and begin a more formal exercise program. The patient should continue  his antiplatelet therapy and aggressive treatment of the lipid abnormalities.  Patient will follow-up with me on a PRN basis   4. Mixed hyperlipidemia Continue statin as ordered and reviewed, no changes at this time     Hortencia Pilar, MD  12/21/2020 11:49 AM

## 2021-01-04 ENCOUNTER — Other Ambulatory Visit: Payer: Self-pay | Admitting: Neurosurgery

## 2021-01-11 ENCOUNTER — Other Ambulatory Visit: Payer: Self-pay

## 2021-01-11 ENCOUNTER — Ambulatory Visit (INDEPENDENT_AMBULATORY_CARE_PROVIDER_SITE_OTHER): Payer: Medicare PPO

## 2021-01-11 DIAGNOSIS — L570 Actinic keratosis: Secondary | ICD-10-CM | POA: Diagnosis not present

## 2021-01-11 MED ORDER — AMINOLEVULINIC ACID HCL 20 % EX SOLR
1.0000 "application " | Freq: Once | CUTANEOUS | Status: AC
  Administered 2021-01-11: 354 mg via TOPICAL

## 2021-01-11 NOTE — Patient Instructions (Signed)

## 2021-01-11 NOTE — Progress Notes (Signed)
1. AK (actinic keratosis) Scalp  Photodynamic therapy - Scalp Procedure discussed: discussed risks, benefits, side effects. and alternatives   Prep: site scrubbed/prepped with acetone   Location:  Scalp Number of lesions:  Multiple Type of treatment:  Blue light Aminolevulinic Acid (see MAR for details): Levulan Number of Levulan sticks used:  1 Incubation time (minutes):  120 Number of minutes under lamp:  16 Number of seconds under lamp:  40 Cooling:  Floor fan Outcome: patient tolerated procedure well with no complications   Post-procedure details: sunscreen applied and aftercare instructions given to patient    Aminolevulinic Acid HCl 20 % SOLR 354 mg - Scalp

## 2021-01-17 ENCOUNTER — Other Ambulatory Visit: Payer: Self-pay

## 2021-01-17 ENCOUNTER — Other Ambulatory Visit
Admission: RE | Admit: 2021-01-17 | Discharge: 2021-01-17 | Disposition: A | Discharge status: Home / Self Care | Payer: Medicare PPO | Source: Ambulatory Visit | Attending: Neurosurgery | Admitting: Neurosurgery

## 2021-01-17 DIAGNOSIS — Z01818 Encounter for other preprocedural examination: Secondary | ICD-10-CM | POA: Diagnosis not present

## 2021-01-17 HISTORY — DX: Gastro-esophageal reflux disease without esophagitis: K21.9

## 2021-01-17 HISTORY — DX: Bell's palsy: G51.0

## 2021-01-17 HISTORY — DX: Disorder of the skin and subcutaneous tissue, unspecified: L98.9

## 2021-01-17 HISTORY — DX: Acute embolism and thrombosis of unspecified deep veins of unspecified lower extremity: I82.409

## 2021-01-17 LAB — URINALYSIS, ROUTINE W REFLEX MICROSCOPIC
Bilirubin Urine: NEGATIVE
Glucose, UA: NEGATIVE mg/dL
Hgb urine dipstick: NEGATIVE
Ketones, ur: NEGATIVE mg/dL
Leukocytes,Ua: NEGATIVE
Nitrite: NEGATIVE
Protein, ur: NEGATIVE mg/dL
Specific Gravity, Urine: 1.008 (ref 1.005–1.030)
pH: 6 (ref 5.0–8.0)

## 2021-01-17 LAB — TYPE AND SCREEN
ABO/RH(D): O POS
Antibody Screen: NEGATIVE

## 2021-01-17 LAB — SURGICAL PCR SCREEN
MRSA, PCR: NEGATIVE
Staphylococcus aureus: NEGATIVE

## 2021-01-17 LAB — PROTIME-INR
INR: 1 (ref 0.8–1.2)
Prothrombin Time: 13.6 seconds (ref 11.4–15.2)

## 2021-01-17 LAB — APTT: aPTT: 37 seconds — ABNORMAL HIGH (ref 24–36)

## 2021-01-17 NOTE — Patient Instructions (Addendum)
Your procedure is scheduled on: 01/24/21 Report to [facility]. To find out your arrival time please call [phone] between 1PM - 3PM on 01/23/21.  Remember: Instructions that are not followed completely may result in serious medical risk, up to and including death, or upon the discretion of your surgeon and anesthesiologist your surgery may need to be rescheduled.     _X__ 1. Do not eat food after midnight the night before your procedure.                 No gum chewing or hard candies. You may drink clear liquids up to 2 hours                 before you are scheduled to arrive for your surgery- DO not drink clear                 liquids within 2 hours of the start of your surgery.                 Clear Liquids include:  water, apple juice without pulp, clear carbohydrate                 drink such as Clearfast or Gatorade, Black Coffee or Tea (Do not add                 anything to coffee or tea). Diabetics water only  __X__2.  On the morning of surgery brush your teeth with toothpaste and water, you                 may rinse your mouth with mouthwash if you wish.  Do not swallow any              toothpaste of mouthwash.     _X__ 3.  No Alcohol for 24 hours before or after surgery.   _X__ 4.  Do Not Smoke or use e-cigarettes For 24 Hours Prior to Your Surgery.                 Do not use any chewable tobacco products for at least 6 hours prior to                 surgery.  ____  5.  Bring all medications with you on the day of surgery if instructed.   __X__  6.  Notify your doctor if there is any change in your medical condition      (cold, fever, infections).     Do not wear jewelry, make-up, hairpins, clips or nail polish. Do not wear lotions, powders, or perfumes.  Do not shave 48 hours prior to surgery. Men may shave face and neck. Do not bring valuables to the hospital.    [facility] is not responsible for any belongings or  valuables.  Contacts, dentures/partials or body piercings may not be worn into surgery. Bring a case for your contacts, glasses or hearing aids, a denture cup will be supplied. Leave your suitcase in the car. After surgery it may be brought to your room. For patients admitted to the hospital, discharge time is determined by your treatment team.   Patients discharged the day of surgery will not be allowed to drive home.   Please read over the following fact sheets that you were given:   MRSA Information  __X__ Take these medicines the morning of surgery with A SIP OF WATER:  1. finasteride (PROSCAR) 5 MG tablet  2. gabapentin (NEURONTIN) 300 MG capsule  3. nabumetone (RELAFEN) 500 MG tablet  4.  5.  6.  ____ Fleet Enema (as directed)   __X__ Use CHG Soap/SAGE wipes as directed  ____ Use inhalers on the day of surgery  ____ Stop metformin/Janumet/Farxiga 2 days prior to surgery    ____ Take 1/2 of usual insulin dose the night before surgery. No insulin the morning          of surgery.   ____ Stop Blood Thinners Coumadin/Plavix/Xarelto/Pleta/Pradaxa/Eliquis/Effient/Aspirin  on   Or contact your Surgeon, Cardiologist or Medical Doctor regarding  ability to stop your blood thinners  __X__ Stop Anti-inflammatories 7 days before surgery such as Advil, Ibuprofen, Motrin,  BC or Goodies Powder, Naprosyn, Naproxen, Aleve, Aspirin    __X__ Stop all herbal supplements, fish oil or vitamin E until after surgery.    ____ Bring C-Pap to the hospital.

## 2021-01-22 ENCOUNTER — Other Ambulatory Visit
Admission: RE | Admit: 2021-01-22 | Discharge: 2021-01-22 | Disposition: A | Discharge status: Home / Self Care | Payer: Medicare PPO | Source: Ambulatory Visit | Attending: Neurosurgery | Admitting: Neurosurgery

## 2021-01-22 ENCOUNTER — Other Ambulatory Visit: Payer: Self-pay

## 2021-01-22 DIAGNOSIS — Z01812 Encounter for preprocedural laboratory examination: Secondary | ICD-10-CM | POA: Insufficient documentation

## 2021-01-22 DIAGNOSIS — Z20822 Contact with and (suspected) exposure to covid-19: Secondary | ICD-10-CM | POA: Insufficient documentation

## 2021-01-22 LAB — SARS CORONAVIRUS 2 (TAT 6-24 HRS): SARS Coronavirus 2: NEGATIVE

## 2021-01-24 ENCOUNTER — Encounter: Payer: Self-pay | Admitting: Neurosurgery

## 2021-01-24 ENCOUNTER — Other Ambulatory Visit: Payer: Self-pay

## 2021-01-24 ENCOUNTER — Inpatient Hospital Stay: Payer: Medicare PPO

## 2021-01-24 ENCOUNTER — Encounter
Admission: RE | Disposition: A | Discharge status: Home / Self Care | Payer: Self-pay | Source: Ambulatory Visit | Attending: Neurosurgery

## 2021-01-24 ENCOUNTER — Inpatient Hospital Stay
Admission: RE | Admit: 2021-01-24 | Discharge: 2021-01-27 | DRG: 520 | Disposition: A | Discharge status: Home / Self Care | Payer: Medicare PPO | Attending: Neurosurgery | Admitting: Neurosurgery

## 2021-01-24 DIAGNOSIS — E785 Hyperlipidemia, unspecified: Secondary | ICD-10-CM | POA: Diagnosis present

## 2021-01-24 DIAGNOSIS — Z87891 Personal history of nicotine dependence: Secondary | ICD-10-CM

## 2021-01-24 DIAGNOSIS — N400 Enlarged prostate without lower urinary tract symptoms: Secondary | ICD-10-CM | POA: Diagnosis present

## 2021-01-24 DIAGNOSIS — Z7982 Long term (current) use of aspirin: Secondary | ICD-10-CM | POA: Diagnosis not present

## 2021-01-24 DIAGNOSIS — K219 Gastro-esophageal reflux disease without esophagitis: Secondary | ICD-10-CM | POA: Diagnosis present

## 2021-01-24 DIAGNOSIS — Z86010 Personal history of colonic polyps: Secondary | ICD-10-CM

## 2021-01-24 DIAGNOSIS — G959 Disease of spinal cord, unspecified: Secondary | ICD-10-CM | POA: Diagnosis present

## 2021-01-24 DIAGNOSIS — I739 Peripheral vascular disease, unspecified: Secondary | ICD-10-CM | POA: Diagnosis present

## 2021-01-24 DIAGNOSIS — Z419 Encounter for procedure for purposes other than remedying health state, unspecified: Secondary | ICD-10-CM

## 2021-01-24 DIAGNOSIS — M50023 Cervical disc disorder at C6-C7 level with myelopathy: Secondary | ICD-10-CM | POA: Diagnosis present

## 2021-01-24 DIAGNOSIS — Z8679 Personal history of other diseases of the circulatory system: Secondary | ICD-10-CM

## 2021-01-24 DIAGNOSIS — Z79899 Other long term (current) drug therapy: Secondary | ICD-10-CM

## 2021-01-24 DIAGNOSIS — Z20822 Contact with and (suspected) exposure to covid-19: Secondary | ICD-10-CM | POA: Diagnosis present

## 2021-01-24 LAB — ABO/RH: ABO/RH(D): O POS

## 2021-01-24 SURGERY — CERVICAL LAMINOPLASTY
Anesthesia: General

## 2021-01-24 MED ORDER — ACETAMINOPHEN 500 MG PO TABS
1000.0000 mg | ORAL_TABLET | Freq: Four times a day (QID) | ORAL | Status: AC
  Administered 2021-01-24 – 2021-01-25 (×3): 1000 mg via ORAL
  Filled 2021-01-24 (×4): qty 2

## 2021-01-24 MED ORDER — ONDANSETRON HCL 4 MG/2ML IJ SOLN: 4.0000 mg | Freq: Four times a day (QID) | INTRAMUSCULAR | Status: DC | PRN

## 2021-01-24 MED ORDER — FLEET ENEMA 7-19 GM/118ML RE ENEM
1.0000 | ENEMA | Freq: Once | RECTAL | Status: AC | PRN
  Administered 2021-01-27: 1 via RECTAL

## 2021-01-24 MED ORDER — APREPITANT 40 MG PO CAPS
ORAL_CAPSULE | ORAL | Status: AC
  Administered 2021-01-24: 40 mg via OROMUCOSAL
  Filled 2021-01-24: qty 1

## 2021-01-24 MED ORDER — ONDANSETRON HCL 4 MG/2ML IJ SOLN
INTRAMUSCULAR | Status: AC
  Filled 2021-01-24: qty 2

## 2021-01-24 MED ORDER — ONDANSETRON HCL 4 MG/2ML IJ SOLN: 4.0000 mg | Freq: Once | INTRAMUSCULAR | Status: DC | PRN

## 2021-01-24 MED ORDER — SUCCINYLCHOLINE CHLORIDE 20 MG/ML IJ SOLN
INTRAMUSCULAR | Status: DC | PRN
  Administered 2021-01-24: 100 mg via INTRAVENOUS

## 2021-01-24 MED ORDER — POTASSIUM CHLORIDE IN NACL 20-0.9 MEQ/L-% IV SOLN
INTRAVENOUS | Status: DC
  Filled 2021-01-24 (×7): qty 1000

## 2021-01-24 MED ORDER — REMIFENTANIL HCL 1 MG IV SOLR
INTRAVENOUS | Status: AC
  Filled 2021-01-24: qty 1000

## 2021-01-24 MED ORDER — FENTANYL CITRATE (PF) 100 MCG/2ML IJ SOLN
INTRAMUSCULAR | Status: AC
  Filled 2021-01-24: qty 2

## 2021-01-24 MED ORDER — CHLORHEXIDINE GLUCONATE 0.12 % MT SOLN
OROMUCOSAL | Status: AC
  Administered 2021-01-24: 15 mL via OROMUCOSAL
  Filled 2021-01-24: qty 15

## 2021-01-24 MED ORDER — SODIUM CHLORIDE 0.9 % IV SOLN
INTRAVENOUS | Status: DC | PRN
  Administered 2021-01-24: 40 mL

## 2021-01-24 MED ORDER — PROPOFOL 500 MG/50ML IV EMUL
INTRAVENOUS | Status: AC
  Filled 2021-01-24: qty 50

## 2021-01-24 MED ORDER — ONDANSETRON HCL 4 MG PO TABS: 4.0000 mg | ORAL_TABLET | Freq: Four times a day (QID) | ORAL | Status: DC | PRN

## 2021-01-24 MED ORDER — NABUMETONE 500 MG PO TABS
500.0000 mg | ORAL_TABLET | Freq: Two times a day (BID) | ORAL | Status: DC
  Administered 2021-01-24 – 2021-01-27 (×6): 500 mg via ORAL
  Filled 2021-01-24 (×7): qty 1

## 2021-01-24 MED ORDER — BUPIVACAINE-EPINEPHRINE (PF) 0.5% -1:200000 IJ SOLN
INTRAMUSCULAR | Status: DC | PRN
  Administered 2021-01-24: 10 mL

## 2021-01-24 MED ORDER — ACETAMINOPHEN 650 MG RE SUPP: 650.0000 mg | RECTAL | Status: DC | PRN

## 2021-01-24 MED ORDER — SUCCINYLCHOLINE CHLORIDE 200 MG/10ML IV SOSY
PREFILLED_SYRINGE | INTRAVENOUS | Status: AC
  Filled 2021-01-24: qty 10

## 2021-01-24 MED ORDER — SENNOSIDES-DOCUSATE SODIUM 8.6-50 MG PO TABS: 1.0000 | ORAL_TABLET | Freq: Every evening | ORAL | Status: DC | PRN

## 2021-01-24 MED ORDER — PROPOFOL 10 MG/ML IV BOLUS
INTRAVENOUS | Status: DC | PRN
  Administered 2021-01-24: 40 mg via INTRAVENOUS
  Administered 2021-01-24: 50 mg via INTRAVENOUS
  Administered 2021-01-24: 150 mg via INTRAVENOUS

## 2021-01-24 MED ORDER — HYDROMORPHONE HCL 1 MG/ML IJ SOLN
INTRAMUSCULAR | Status: AC
  Filled 2021-01-24: qty 1

## 2021-01-24 MED ORDER — METHOCARBAMOL 1000 MG/10ML IJ SOLN
500.0000 mg | Freq: Four times a day (QID) | INTRAVENOUS | Status: DC | PRN
  Filled 2021-01-24: qty 5

## 2021-01-24 MED ORDER — DEXAMETHASONE SODIUM PHOSPHATE 10 MG/ML IJ SOLN
INTRAMUSCULAR | Status: AC
  Filled 2021-01-24: qty 1

## 2021-01-24 MED ORDER — PHENOL 1.4 % MT LIQD
1.0000 | OROMUCOSAL | Status: DC | PRN
  Filled 2021-01-24: qty 177

## 2021-01-24 MED ORDER — MIDAZOLAM HCL 2 MG/2ML IJ SOLN
INTRAMUSCULAR | Status: AC
  Filled 2021-01-24: qty 2

## 2021-01-24 MED ORDER — DEXAMETHASONE SODIUM PHOSPHATE 10 MG/ML IJ SOLN
INTRAMUSCULAR | Status: DC | PRN
  Administered 2021-01-24 (×2): 10 mg via INTRAVENOUS

## 2021-01-24 MED ORDER — PROPOFOL 500 MG/50ML IV EMUL
INTRAVENOUS | Status: DC | PRN
  Administered 2021-01-24: 150 ug/kg/min via INTRAVENOUS

## 2021-01-24 MED ORDER — FAMOTIDINE 20 MG PO TABS: 20.0000 mg | ORAL_TABLET | Freq: Once | ORAL | Status: AC

## 2021-01-24 MED ORDER — SCOPOLAMINE 1 MG/3DAYS TD PT72
MEDICATED_PATCH | TRANSDERMAL | Status: AC
  Filled 2021-01-24: qty 1

## 2021-01-24 MED ORDER — CHLORHEXIDINE GLUCONATE 0.12 % MT SOLN: 15.0000 mL | Freq: Once | OROMUCOSAL | Status: AC

## 2021-01-24 MED ORDER — ACETAMINOPHEN 10 MG/ML IV SOLN
INTRAVENOUS | Status: AC
  Filled 2021-01-24: qty 100

## 2021-01-24 MED ORDER — ONDANSETRON HCL 4 MG/2ML IJ SOLN
INTRAMUSCULAR | Status: DC | PRN
  Administered 2021-01-24: 4 mg via INTRAVENOUS

## 2021-01-24 MED ORDER — FAMOTIDINE 20 MG PO TABS
ORAL_TABLET | ORAL | Status: AC
  Administered 2021-01-24: 20 mg via ORAL
  Filled 2021-01-24: qty 1

## 2021-01-24 MED ORDER — ORAL CARE MOUTH RINSE: 15.0000 mL | Freq: Once | OROMUCOSAL | Status: AC

## 2021-01-24 MED ORDER — SCOPOLAMINE 1 MG/3DAYS TD PT72
MEDICATED_PATCH | TRANSDERMAL | Status: DC | PRN
  Administered 2021-01-24: 1 via TRANSDERMAL

## 2021-01-24 MED ORDER — GLYCOPYRROLATE 0.2 MG/ML IJ SOLN
INTRAMUSCULAR | Status: AC
  Filled 2021-01-24: qty 1

## 2021-01-24 MED ORDER — LIDOCAINE HCL (PF) 2 % IJ SOLN
INTRAMUSCULAR | Status: AC
  Filled 2021-01-24: qty 5

## 2021-01-24 MED ORDER — PHENYLEPHRINE HCL (PRESSORS) 10 MG/ML IV SOLN
INTRAVENOUS | Status: DC | PRN
  Administered 2021-01-24: 100 ug via INTRAVENOUS
  Administered 2021-01-24: 200 ug via INTRAVENOUS
  Administered 2021-01-24 (×2): 100 ug via INTRAVENOUS

## 2021-01-24 MED ORDER — BISACODYL 5 MG PO TBEC
5.0000 mg | DELAYED_RELEASE_TABLET | Freq: Every day | ORAL | Status: DC | PRN
  Administered 2021-01-26: 5 mg via ORAL
  Filled 2021-01-24: qty 1

## 2021-01-24 MED ORDER — PROPOFOL 10 MG/ML IV BOLUS
INTRAVENOUS | Status: AC
  Filled 2021-01-24: qty 60

## 2021-01-24 MED ORDER — CEFAZOLIN SODIUM-DEXTROSE 2-4 GM/100ML-% IV SOLN
2.0000 g | Freq: Once | INTRAVENOUS | Status: AC
  Administered 2021-01-24: 2 g via INTRAVENOUS

## 2021-01-24 MED ORDER — LACTATED RINGERS IV SOLN: INTRAVENOUS | Status: DC

## 2021-01-24 MED ORDER — ACETAMINOPHEN 325 MG PO TABS
650.0000 mg | ORAL_TABLET | ORAL | Status: DC | PRN
  Administered 2021-01-26 – 2021-01-27 (×2): 650 mg via ORAL
  Filled 2021-01-24 (×2): qty 2

## 2021-01-24 MED ORDER — HYDROMORPHONE HCL 1 MG/ML IJ SOLN
INTRAMUSCULAR | Status: DC | PRN
  Administered 2021-01-24: .25 mg via INTRAVENOUS

## 2021-01-24 MED ORDER — CEFAZOLIN SODIUM-DEXTROSE 2-4 GM/100ML-% IV SOLN
INTRAVENOUS | Status: AC
  Filled 2021-01-24: qty 100

## 2021-01-24 MED ORDER — ACETAMINOPHEN 10 MG/ML IV SOLN
INTRAVENOUS | Status: DC | PRN
  Administered 2021-01-24: 1000 mg via INTRAVENOUS

## 2021-01-24 MED ORDER — FENTANYL CITRATE (PF) 100 MCG/2ML IJ SOLN: 25.0000 ug | INTRAMUSCULAR | Status: DC | PRN

## 2021-01-24 MED ORDER — GLYCOPYRROLATE 0.2 MG/ML IJ SOLN
INTRAMUSCULAR | Status: DC | PRN
  Administered 2021-01-24 (×2): .2 mg via INTRAVENOUS

## 2021-01-24 MED ORDER — METHOCARBAMOL 500 MG PO TABS
500.0000 mg | ORAL_TABLET | Freq: Four times a day (QID) | ORAL | Status: DC | PRN
  Administered 2021-01-27: 500 mg via ORAL
  Filled 2021-01-24: qty 1

## 2021-01-24 MED ORDER — THROMBIN 5000 UNITS EX SOLR
CUTANEOUS | Status: DC | PRN
  Administered 2021-01-24: 5000 [IU] via TOPICAL

## 2021-01-24 MED ORDER — APREPITANT 40 MG PO CAPS
40.0000 mg | ORAL_CAPSULE | Freq: Once | ORAL | Status: DC
  Filled 2021-01-24: qty 1

## 2021-01-24 MED ORDER — SODIUM CHLORIDE 0.9% FLUSH: 3.0000 mL | INTRAVENOUS | Status: DC | PRN

## 2021-01-24 MED ORDER — REMIFENTANIL HCL 1 MG IV SOLR
INTRAVENOUS | Status: DC | PRN
  Administered 2021-01-24: .1 ug/kg/min via INTRAVENOUS

## 2021-01-24 MED ORDER — OXYCODONE HCL 5 MG PO TABS
10.0000 mg | ORAL_TABLET | ORAL | Status: DC | PRN
  Administered 2021-01-25 (×2): 10 mg via ORAL
  Filled 2021-01-24 (×2): qty 2

## 2021-01-24 MED ORDER — SODIUM CHLORIDE 0.9% FLUSH
3.0000 mL | Freq: Two times a day (BID) | INTRAVENOUS | Status: DC
  Administered 2021-01-24 – 2021-01-27 (×6): 3 mL via INTRAVENOUS

## 2021-01-24 MED ORDER — GABAPENTIN 300 MG PO CAPS
300.0000 mg | ORAL_CAPSULE | Freq: Three times a day (TID) | ORAL | Status: DC
  Administered 2021-01-24 – 2021-01-27 (×8): 300 mg via ORAL
  Filled 2021-01-24 (×8): qty 1

## 2021-01-24 MED ORDER — HYDROMORPHONE HCL 1 MG/ML IJ SOLN: 0.5000 mg | INTRAMUSCULAR | Status: DC | PRN

## 2021-01-24 MED ORDER — FINASTERIDE 5 MG PO TABS
5.0000 mg | ORAL_TABLET | Freq: Every day | ORAL | Status: DC
  Administered 2021-01-25 – 2021-01-27 (×3): 5 mg via ORAL
  Filled 2021-01-24 (×3): qty 1

## 2021-01-24 MED ORDER — FENTANYL CITRATE (PF) 100 MCG/2ML IJ SOLN
INTRAMUSCULAR | Status: DC | PRN
  Administered 2021-01-24 (×3): 50 ug via INTRAVENOUS

## 2021-01-24 MED ORDER — LIDOCAINE HCL (CARDIAC) PF 100 MG/5ML IV SOSY
PREFILLED_SYRINGE | INTRAVENOUS | Status: DC | PRN
  Administered 2021-01-24: 80 mg via INTRAVENOUS

## 2021-01-24 MED ORDER — TERAZOSIN HCL 5 MG PO CAPS
5.0000 mg | ORAL_CAPSULE | Freq: Every day | ORAL | Status: DC
  Administered 2021-01-24 – 2021-01-26 (×3): 5 mg via ORAL
  Filled 2021-01-24 (×4): qty 1

## 2021-01-24 MED ORDER — MIDAZOLAM HCL 2 MG/2ML IJ SOLN
INTRAMUSCULAR | Status: DC | PRN
  Administered 2021-01-24: 2 mg via INTRAVENOUS

## 2021-01-24 MED ORDER — MENTHOL 3 MG MT LOZG
1.0000 | LOZENGE | OROMUCOSAL | Status: DC | PRN
  Filled 2021-01-24: qty 9

## 2021-01-24 MED ORDER — BUPIVACAINE HCL (PF) 0.5 % IJ SOLN
INTRAMUSCULAR | Status: DC | PRN
  Administered 2021-01-24: 20 mL

## 2021-01-24 MED ORDER — POLYETHYLENE GLYCOL 3350 17 G PO PACK
17.0000 g | PACK | Freq: Every day | ORAL | Status: DC
  Administered 2021-01-25 – 2021-01-27 (×3): 17 g via ORAL
  Filled 2021-01-24 (×3): qty 1

## 2021-01-24 MED ORDER — SODIUM CHLORIDE 0.9 % IV SOLN
INTRAVENOUS | Status: DC | PRN
  Administered 2021-01-24: 20 ug/min via INTRAVENOUS

## 2021-01-24 MED ORDER — SODIUM CHLORIDE 0.9 % IV SOLN: 250.0000 mL | INTRAVENOUS | Status: DC

## 2021-01-24 MED ORDER — OXYCODONE HCL 5 MG PO TABS
5.0000 mg | ORAL_TABLET | ORAL | Status: DC | PRN
  Administered 2021-01-24 – 2021-01-26 (×6): 5 mg via ORAL
  Filled 2021-01-24 (×6): qty 1

## 2021-01-24 MED ORDER — SIMVASTATIN 20 MG PO TABS
20.0000 mg | ORAL_TABLET | Freq: Every day | ORAL | Status: DC
  Administered 2021-01-24 – 2021-01-26 (×3): 20 mg via ORAL
  Filled 2021-01-24 (×3): qty 1

## 2021-01-24 MED ORDER — EPHEDRINE SULFATE 50 MG/ML IJ SOLN
INTRAMUSCULAR | Status: DC | PRN
  Administered 2021-01-24 (×3): 10 mg via INTRAVENOUS

## 2021-01-24 SURGICAL SUPPLY — 70 items
AGENT HMST MTR 8 SURGIFLO (HEMOSTASIS) ×1
APL PRP STRL LF DISP 70% ISPRP (MISCELLANEOUS) ×2
BASKET BONE COLLECTION (BASKET) IMPLANT
BULB RESERV EVAC DRAIN JP 100C (MISCELLANEOUS) IMPLANT
BUR NEURO DRILL SOFT 3.0X3.8M (BURR) ×2 IMPLANT
CHLORAPREP W/TINT 26 (MISCELLANEOUS) ×4 IMPLANT
COUNTER NEEDLE 20/40 LG (NEEDLE) ×2 IMPLANT
COVER WAND RF STERILE (DRAPES) ×2 IMPLANT
CUP MEDICINE 2OZ PLAST GRAD ST (MISCELLANEOUS) ×2 IMPLANT
DEPTH SLEEVE 6 (SLEEVE) ×2
DERMABOND ADVANCED (GAUZE/BANDAGES/DRESSINGS) ×1
DERMABOND ADVANCED .7 DNX12 (GAUZE/BANDAGES/DRESSINGS) ×1 IMPLANT
DRAIN CHANNEL JP 10F RND 20C F (MISCELLANEOUS) IMPLANT
DRAPE C ARM PK CFD 31 SPINE (DRAPES) ×2 IMPLANT
DRAPE LAPAROTOMY 77X122 PED (DRAPES) ×2 IMPLANT
DRAPE MICROSCOPE SPINE 48X150 (DRAPES) IMPLANT
DRAPE SURG 17X11 SM STRL (DRAPES) ×12 IMPLANT
ELECT CAUTERY BLADE TIP 2.5 (TIP) ×4
ELECT REM PT RETURN 9FT ADLT (ELECTROSURGICAL) ×2
ELECTRODE CAUTERY BLDE TIP 2.5 (TIP) ×2 IMPLANT
ELECTRODE REM PT RTRN 9FT ADLT (ELECTROSURGICAL) ×1 IMPLANT
FEE INTRAOP CADWELL SUPPLY NCS (MISCELLANEOUS) ×1 IMPLANT
FEE INTRAOP MONITOR IMPULS NCS (MISCELLANEOUS) ×1 IMPLANT
GAUZE XEROFORM 1X8 LF (GAUZE/BANDAGES/DRESSINGS) ×2 IMPLANT
GLOVE SURG SYN 8.5  E (GLOVE) ×6
GLOVE SURG SYN 8.5 E (GLOVE) ×3 IMPLANT
GOWN SRG XL LVL 3 NONREINFORCE (GOWNS) ×1 IMPLANT
GOWN STRL NON-REIN TWL XL LVL3 (GOWNS) ×2
GOWN STRL REUS W/ TWL XL LVL3 (GOWN DISPOSABLE) ×1 IMPLANT
GOWN STRL REUS W/TWL XL LVL3 (GOWN DISPOSABLE) ×2
GRADUATE 1200CC STRL 31836 (MISCELLANEOUS) ×2 IMPLANT
INTRAOP CADWELL SUPPLY FEE NCS (MISCELLANEOUS) ×1
INTRAOP DISP SUPPLY FEE NCS (MISCELLANEOUS) ×2
INTRAOP MONITOR FEE IMPULS NCS (MISCELLANEOUS) ×1
INTRAOP MONITOR FEE IMPULSE (MISCELLANEOUS) ×2
KIT TURNOVER KIT A (KITS) ×2 IMPLANT
MANIFOLD NEPTUNE II (INSTRUMENTS) ×2 IMPLANT
MARKER SKIN DUAL TIP RULER LAB (MISCELLANEOUS) ×4 IMPLANT
NDL SAFETY ECLIPSE 18X1.5 (NEEDLE) ×1 IMPLANT
NEEDLE HYPO 18GX1.5 SHARP (NEEDLE) ×2
NEEDLE HYPO 22GX1.5 SAFETY (NEEDLE) ×4 IMPLANT
NS IRRIG 1000ML POUR BTL (IV SOLUTION) ×2 IMPLANT
PACK LAMINECTOMY NEURO (CUSTOM PROCEDURE TRAY) ×2 IMPLANT
PAD ARMBOARD 7.5X6 YLW CONV (MISCELLANEOUS) ×2 IMPLANT
PIN CASPAR 14 (PIN) IMPLANT
PIN CASPAR 14MM (PIN)
PIN MAYFIELD SKULL DISP (PIN) ×2 IMPLANT
PLATE CANOPY GRAFT ADJ 7 (Plate) ×1 IMPLANT
PLATE CANOPY SHELF ADJ 5 (Plate) ×8 IMPLANT
PLATE CANOPY SHELF ADJ 5 TT (Plate) ×4 IMPLANT
PLATE CANOPY SHELF ADJ 7 (Plate) ×2 IMPLANT
SCREW NONLOCK HEX LP S 3.5X56 (Screw) ×40 IMPLANT
SLEEVE DEPTH 6 (SLEEVE) ×1 IMPLANT
SPOGE SURGIFLO 8M (HEMOSTASIS) ×2
SPONGE KITTNER 5P (MISCELLANEOUS) IMPLANT
SPONGE SURGIFLO 8M (HEMOSTASIS) ×1 IMPLANT
STAPLER SKIN PROX 35W (STAPLE) IMPLANT
SUT ETHILON 3-0 FS-10 30 BLK (SUTURE) ×2
SUT V-LOC 90 ABS DVC 3-0 CL (SUTURE) ×2 IMPLANT
SUT VIC AB 0 CT1 27 (SUTURE) ×4
SUT VIC AB 0 CT1 27XCR 8 STRN (SUTURE) ×2 IMPLANT
SUT VIC AB 2-0 CT1 18 (SUTURE) ×4 IMPLANT
SUT VIC AB 3-0 SH 8-18 (SUTURE) ×2 IMPLANT
SUTURE EHLN 3-0 FS-10 30 BLK (SUTURE) ×1 IMPLANT
SYR 30ML LL (SYRINGE) ×2 IMPLANT
TAPE CLOTH 3X10 WHT NS LF (GAUZE/BANDAGES/DRESSINGS) ×4 IMPLANT
TOWEL OR 17X26 4PK STRL BLUE (TOWEL DISPOSABLE) ×6 IMPLANT
TRAY FOLEY MTR SLVR 16FR STAT (SET/KITS/TRAYS/PACK) IMPLANT
TUBING CONNECTING 10 (TUBING) ×2 IMPLANT
depth sleeve, 6mm IMPLANT

## 2021-01-24 NOTE — Transfer of Care (Signed)
Immediate Anesthesia Transfer of Care Note  Patient: Dwayne Smith  Procedure(s) Performed: C3-7 CERVICAL LAMINOPLASTY (N/A )  Patient Location: PACU  Anesthesia Type:General  Level of Consciousness: drowsy and patient cooperative  Airway & Oxygen Therapy: Patient Spontanous Breathing and Patient connected to face mask  Post-op Assessment: Report given to RN, Post -op Vital signs reviewed and stable and Patient moving all extremities X 4  Post vital signs: Reviewed and stable  Last Vitals:  Vitals Value Taken Time  BP 148/90 01/24/21 1545  Temp 36.1 C 01/24/21 1545  Pulse    Resp 16 01/24/21 1545  SpO2 100 % 01/24/21 1545    Last Pain:  Vitals:   01/24/21 1545  TempSrc:   PainSc: 0-No pain         Complications: No complications documented.

## 2021-01-24 NOTE — H&P (Signed)
I have reviewed and confirmed my history and physical from 12/29/20 with no additions or changes. Plan for C3-7 laminoplasty with L C5-6 and C6-7 foraminotomies.  Risks and benefits reviewed.  Heart sounds normal no MRG. Chest Clear to Auscultation Bilaterally.

## 2021-01-24 NOTE — Anesthesia Procedure Notes (Signed)
Procedure Name: Intubation Date/Time: 01/24/2021 12:50 PM Performed by: Kelton Pillar, CRNA Pre-anesthesia Checklist: Patient identified, Emergency Drugs available, Suction available and Patient being monitored Patient Re-evaluated:Patient Re-evaluated prior to induction Oxygen Delivery Method: Circle system utilized Preoxygenation: Pre-oxygenation with 100% oxygen Induction Type: IV induction Ventilation: Mask ventilation without difficulty Laryngoscope Size: McGraph and 4 Grade View: Grade I Tube type: Oral Tube size: 7.0 mm Number of attempts: 1 Airway Equipment and Method: Stylet and Oral airway Placement Confirmation: ETT inserted through vocal cords under direct vision,  positive ETCO2 and breath sounds checked- equal and bilateral Secured at: 21 (at teeth) cm Tube secured with: Tape Dental Injury: Teeth and Oropharynx as per pre-operative assessment

## 2021-01-24 NOTE — Anesthesia Preprocedure Evaluation (Signed)
Anesthesia Evaluation  Patient identified by MRN, date of birth, ID band Patient awake    Reviewed: Allergy & Precautions, NPO status , Patient's Chart, lab work & pertinent test results  History of Anesthesia Complications (+) PONV and history of anesthetic complications  Airway Mallampati: II  TM Distance: >3 FB Neck ROM: Full    Dental  (+) Poor Dentition   Pulmonary neg sleep apnea, neg COPD, former smoker,    breath sounds clear to auscultation- rhonchi (-) wheezing      Cardiovascular Exercise Tolerance: Good (-) hypertension+ Peripheral Vascular Disease and + DVT  (-) CAD, (-) Past MI, (-) Cardiac Stents and (-) CABG  Rhythm:Regular Rate:Normal - Systolic murmurs and - Diastolic murmurs    Neuro/Psych neg Seizures  Neuromuscular disease negative psych ROS   GI/Hepatic Neg liver ROS, GERD  ,  Endo/Other  negative endocrine ROSneg diabetes  Renal/GU negative Renal ROS     Musculoskeletal  (+) Arthritis ,   Abdominal (+) - obese,   Peds negative pediatric ROS (+)  Hematology negative hematology ROS (+)   Anesthesia Other Findings Past Medical History: No date: Arthritis No date: BPH (benign prostatic hyperplasia) No date: Cataract cortical, senile No date: Hyperglycemia No date: PONV (postoperative nausea and vomiting)     Comment:  shoulder surgery at [facility] No date: PVD (peripheral vascular disease) ([facility])   Reproductive/Obstetrics                             Anesthesia Physical  Anesthesia Plan  ASA: III  Anesthesia Plan: General   Post-op Pain Management:    Induction: Intravenous  PONV Risk Score and Plan:   Airway Management Planned: Oral ETT  Additional Equipment:   Intra-op Plan:   Post-operative Plan: Extubation in OR  Informed Consent: I have reviewed the patients History and Physical, chart, labs and discussed the procedure including the risks, benefits and  alternatives for the proposed anesthesia with the patient or authorized representative who has indicated his/her understanding and acceptance.     Dental advisory given  Plan Discussed with: CRNA and Anesthesiologist  Anesthesia Plan Comments:         Anesthesia Quick Evaluation

## 2021-01-24 NOTE — Op Note (Signed)
Indications: Mr. Bontempo is a 75 yo male who presented with cervical myelopathy.  He had worsening weakness, prompting surgical intervention.  Findings: severe stenosis  Preoperative Diagnosis: Cervical myelopathy Postoperative Diagnosis: same   EBL: 200 ml IVF: 1000 ml Drains: 1 placed Disposition: Extubated and Stable to PACU Complications: none  No foley catheter was placed.   Preoperative Note:   Risks of surgery discussed include: infection, bleeding, stroke, coma, death, paralysis, CSF leak, nerve/spinal cord injury, numbness, tingling, weakness, complex regional pain syndrome, recurrent stenosis and/or disc herniation, vascular injury, development of instability, neck/back pain, need for further surgery, persistent symptoms, development of deformity, and the risks of anesthesia. The patient understood these risks and agreed to proceed.  Operative Note:   OPERATIVE PROCEDURE:  1. Posterior cervical laminoplasty C3-7 using Globus implants  OPERATIVE PROCEDURE:  After induction of general anesthesia, the [place] headholder was applied. The patient was placed in the prone position on the table and secured in position. A midline incision was then planned using fluoroscopy.  A timeout was performed, and antibiotics given.  Next, the posterior cervical region was prepped and draped in the usual sterile fashion. The incision was injected with local anesthetic, the opened sharply. A subperiosteal dissection was then carried out to expose the posterior elements from C2 to C7, with careful attention paid to maintaining the facet capsules.  After satisfactory exposure had been obtained, our attention was turned to forming the laminoplasty.   On the right, the lamina were scored from C3 to C7.  On the left, a full thickness cut was made from C3 to C7.  The ligamentum was divided, and any compression removed with a 1 and 2 mm kerrison punch.  At this point,  I sized the laminoplasty defects,  and placed laminoplasty plates at C3, C4, C5, C6, and C7.  Prior to that, a left C5-6 and C6-7 foraminotomy was performed until the nerve root were decompressed.  After decompression was complete, final AP and lateral radiographs were taken.  The wound was copiously irrigated with bacitracin-containing solution and hemostasis was achieved.   A Hemovac drain was then placed in the wound deep to the fascia.   The wound was closed in a multilayer fashion using interrupted 0 and 2-0 Vicryl sutures.  The final skin edges were reapproximated using staples.    Patient was then handed back over to anesthesia.  All counts were correct at the conclusion of the procedure.  Neurological monitoring was used throughout, and there were no changes.  Liliane Bade PA acted as an Pensions consultant throughout the case.   Meade Maw MD

## 2021-01-25 NOTE — Evaluation (Signed)
Occupational Therapy Evaluation Patient Details Name: Dwayne Smith MRN: [id number] DOB: Jul 21, 1946 Today's Date: 01/25/2021    History of Present Illness Dwayne Smith is here for cervical myelopathy, 01/24/21 - C3-7 laminoplasty   Clinical Impression   Pt seen for OT evaluation this date, POD#1 from surgery noted above. Prior to hospital admission, pt was independent with ADL and IADL, using a walking stick for mobility, and enjoyed gardening. No falls in past 12 months. Pt lives with spouse in a single family home, level entry. Currently pt requires MIN A for LB ADL tasks and supervision to [place] for ADL transfers with RW. Pt educated in cervical precautions, self care skills, AE, compression stocking mgt, and home/routines modifications to maximize safety and functional independence while minimizing falls risk and maintaining precautions. Pt verbalized understanding of all education/training provided. Handout provided to support recall and carry over of learned precautions/techniques for bed mobility, functional transfers, and self care skills. Pt will benefit from skilled OT services while hospitalized to maximize carryover. Do not anticipate need for skilled OT services after discharge.    Follow Up Recommendations  No OT follow up    Equipment Recommendations  None recommended by OT    Recommendations for Other Services       Precautions / Restrictions Precautions Precautions: Fall;Cervical Precaution Booklet Issued: Yes (comment) Precaution Comments: BLTA Restrictions Weight Bearing Restrictions: No      Mobility Bed Mobility Overal bed mobility: Modified Independent             General bed mobility comments: not assessed, patient received and remained in recliner    Transfers Overall transfer level: Needs assistance Equipment used: Rolling walker (2 wheeled) Transfers: Sit to/from Stand Sit to Stand: Supervision;Min guard         General transfer comment: cues for  hand placement with good carryover    Balance Overall balance assessment: Modified Independent                                         ADL either performed or assessed with clinical judgement   ADL Overall ADL's : Needs assistance/impaired                                       General ADL Comments: Pt requires MIN A for LB ADL tasks in order to maintain precautions, Sup-CGA for ADL transfers     Vision Baseline Vision/History: Wears glasses Wears Glasses: At all times Patient Visual Report: No change from baseline       Perception     Praxis      Pertinent Vitals/Pain Pain Assessment: 0-10 Pain Score: 3  Faces Pain Scale: Hurts a little bit Pain Location: neck Pain Descriptors / Indicators: Aching Pain Intervention(s): Limited activity within patient's tolerance;Monitored during session;Premedicated before session;Repositioned     Hand Dominance Right   Extremity/Trunk Assessment Upper Extremity Assessment Upper Extremity Assessment: Overall WFL for tasks assessed;RUE deficits/detail RUE Deficits / Details: mild impaired coordination but still functional RUE Coordination: decreased fine motor;decreased gross motor   Lower Extremity Assessment Lower Extremity Assessment: RLE deficits/detail RLE Deficits / Details: continues to report weakness of R LE RLE Coordination: decreased gross motor   Cervical / Trunk Assessment Cervical / Trunk Assessment: Normal   Communication Communication Communication: No difficulties  Cognition Arousal/Alertness: Awake/alert Behavior During Therapy: WFL for tasks assessed/performed Overall Cognitive Status: Within Functional Limits for tasks assessed                                     General Comments       Exercises Other Exercises Other Exercises: Pt instructed in cervical precautions and how to maintain during ADL adn IADL tasks, RW mgt, falls prevention, AE/DME    Shoulder Instructions      Home Living Family/patient expects to be discharged to:: Private residence Living Arrangements: Spouse/significant other Available Help at Discharge: Family;Available PRN/intermittently Type of Home: House Home Access: Ramped entrance     Home Layout: One level     Bathroom Shower/Tub: Tub/shower unit;Walk-in shower   Bathroom Toilet: Handicapped height     Home Equipment: Environmental consultant - 2 wheels;Cane - single point;Toilet riser;Grab bars - tub/shower;Hand held shower head;Bedside commode;Adaptive equipment;Shower seat - built in [employer] Equipment: Reacher;Long-handled sponge Additional Comments: was using walking stick prior to admission. He reports he has two walkers at home that he can use as needed      Prior Functioning/Environment Level of Independence: Independent with assistive device(s)        Comments: Patient takes care of his wife (wife on O2, uses rollator), enjoys gardening, yardwork, using a walking stick, no falls        OT Problem List: Decreased strength;Pain;Decreased range of motion;Impaired balance (sitting and/or standing);Decreased knowledge of precautions;Decreased knowledge of use of DME or AE      OT Treatment/Interventions: Self-care/ADL training;Therapeutic exercise;Therapeutic activities;DME and/or AE instruction;Patient/family education;Balance training    OT Goals(Current goals can be found in the care plan section) Acute Rehab OT Goals Patient Stated Goal: to return home, feel better OT Goal Formulation: With patient Time For Goal Achievement: 02/08/21 Potential to Achieve Goals: Good ADL Goals Pt Will Perform Lower Body Dressing: with modified independence;sit to/from stand (maintaining cervical precautions) Pt Will Transfer to Toilet: with modified independence;ambulating;regular height toilet (LRAD for amb, maintaining cervical precautions) Additional ADL Goal #1: Pt will independently instruct family/caregiver  in compression stocking mgt Additional ADL Goal #2: Pt will demonstrate independence with cervical precautions while performing ADL 3/3 opportunities.  OT Frequency: Min 1X/week   Barriers to D/C:            Co-evaluation              AM-PAC OT "6 Clicks" Daily Activity     Outcome Measure Help from another person eating meals?: None Help from another person taking care of personal grooming?: None Help from another person toileting, which includes using toliet, bedpan, or urinal?: A Little Help from another person bathing (including washing, rinsing, drying)?: A Little Help from another person to put on and taking off regular upper body clothing?: None Help from another person to put on and taking off regular lower body clothing?: A Little 6 Click Score: 21   End of Session Equipment Utilized During Treatment: Gait belt;Rolling walker  Activity Tolerance: Patient tolerated treatment well Patient left: in chair;with call bell/phone within reach  OT Visit Diagnosis: Other abnormalities of gait and mobility (R26.89);Muscle weakness (generalized) (M62.81)                Time: 3220-2542 OT Time Calculation (min): 46 min Charges:  OT General Charges $OT Visit: 1 Visit OT Evaluation $OT Eval Moderate Complexity: 1 Mod OT Treatments $Self Care/Home  Management : 38-52 mins  Hanley Hays, MPH, MS, OTR/L ascom [phone] 01/25/21, 1:24 PM

## 2021-01-25 NOTE — Anesthesia Postprocedure Evaluation (Signed)
Anesthesia Post Note  Patient: Dwayne Smith  Procedure(s) Performed: C3-7 CERVICAL LAMINOPLASTY (N/A )  Patient location during evaluation: PACU Anesthesia Type: General Level of consciousness: awake and alert and oriented Pain management: pain level controlled Vital Signs Assessment: post-procedure vital signs reviewed and stable Respiratory status: spontaneous breathing Cardiovascular status: blood pressure returned to baseline Anesthetic complications: no   No complications documented.   Last Vitals:  Vitals:   01/25/21 0800 01/25/21 1253  BP: 125/81 115/72  Pulse: 78 73  Resp: 17 16  Temp: 36.8 C 36.8 C  SpO2: 100% 98%    Last Pain:  Vitals:   01/25/21 1051  TempSrc:   PainSc: 6                  Jamarri Vuncannon

## 2021-01-25 NOTE — Progress Notes (Signed)
Physical Therapy Treatment Patient Details Name: Dwayne Smith MRN: [id number] DOB: 11/12/1945 Today's Date: 01/25/2021    History of Present Illness Dwayne Smith is here for cervical myelopathy, 01/24/21 - C3-7 laminoplasty    PT Comments    Patient received in bed, agrees to PT session. He reports moderate pain in neck this pm. Patient is supervision for bed mobility, requiring assistance with lines.  Patient transfers with min guard and ambulated 250' with RW and min guard. Min assist due to lob x1. Patient is unsteady with mobility and benefits from RW and min guard. He will continue to benefit from skilled PT while here to improve balance and strength for safe return home.       Follow Up Recommendations  Home health PT;Supervision - Intermittent     Equipment Recommendations  None recommended by PT    Recommendations for Other Services       Precautions / Restrictions Precautions Precautions: Fall;Cervical Precaution Booklet Issued: Yes (comment) Precaution Comments: BLTA Restrictions Weight Bearing Restrictions: No    Mobility  Bed Mobility Overal bed mobility: Needs Assistance Bed Mobility: Supine to Sit;Sit to Supine     Supine to sit: Supervision Sit to supine: Supervision   General bed mobility comments: assist with lines, safety    Transfers Overall transfer level: Needs assistance Equipment used: Rolling walker (2 wheeled) Transfers: Sit to/from Stand Sit to Stand: Min guard         General transfer comment: cues for safety  Ambulation/Gait Ambulation/Gait assistance: Min guard;Min assist Gait Distance (Feet): 250 Feet Assistive device: Rolling walker (2 wheeled) Gait Pattern/deviations: Step-through pattern;Decreased step length - right;Decreased step length - left;Decreased stride length Gait velocity: decr   General Gait Details: Patient with unsteadiness initially during ambulation this afternoon. Required min assist due to lob,   Stairs              Wheelchair Mobility    Modified Rankin (Stroke Patients Only)       Balance Overall balance assessment: Needs assistance Sitting-balance support: Feet supported Sitting balance-Leahy Scale: Good     Standing balance support: Bilateral upper extremity supported;During functional activity Standing balance-Leahy Scale: Fair Standing balance comment: reliant on RW and min assist for safety/balance                            Cognition Arousal/Alertness: Awake/alert Behavior During Therapy: WFL for tasks assessed/performed Overall Cognitive Status: Within Functional Limits for tasks assessed                                        Exercises Other Exercises Other Exercises: Pt instructed in cervical precautions and how to maintain during ADL adn IADL tasks, RW mgt, falls prevention, AE/DME    General Comments        Pertinent Vitals/Pain Pain Assessment: Faces Pain Score: 3  Faces Pain Scale: Hurts little more Pain Location: neck Pain Descriptors / Indicators: Aching;Discomfort Pain Intervention(s): Monitored during session;Repositioned    Home Living Family/patient expects to be discharged to:: Private residence Living Arrangements: Spouse/significant other Available Help at Discharge: Family;Available PRN/intermittently Type of Home: House Home Access: Ramped entrance   Home Layout: One level Home Equipment: Walker - 2 wheels;Cane - single point;Toilet riser;Grab bars - tub/shower;Hand held shower head;Bedside commode;Adaptive equipment;Shower seat - built in Additional Comments: was using walking stick prior  to admission. He reports he has two walkers at home that he can use as needed    Prior Function Level of Independence: Independent with assistive device(s)      Comments: Patient takes care of his wife (wife on O2, uses rollator), enjoys gardening, yardwork, using a walking stick, no falls   PT Goals (current goals  can now be found in the care plan section) Acute Rehab PT Goals Patient Stated Goal: to return home, feel better PT Goal Formulation: With patient Time For Goal Achievement: 02/01/21 Potential to Achieve Goals: Good Progress towards PT goals: Progressing toward goals    Frequency    7X/week      PT Plan Current plan remains appropriate    Co-evaluation              AM-PAC PT "6 Clicks" Mobility   Outcome Measure  Help needed turning from your back to your side while in a flat bed without using bedrails?: A Little Help needed moving from lying on your back to sitting on the side of a flat bed without using bedrails?: A Little Help needed moving to and from a bed to a chair (including a wheelchair)?: A Little Help needed standing up from a chair using your arms (e.g., wheelchair or bedside chair)?: A Little Help needed to walk in hospital room?: A Little Help needed climbing 3-5 steps with a railing? : A Little 6 Click Score: 18    End of Session Equipment Utilized During Treatment: Gait belt Activity Tolerance: Patient tolerated treatment well Patient left: in bed;with call bell/phone within reach;with bed alarm set;with SCD's reapplied Nurse Communication: Mobility status PT Visit Diagnosis: Muscle weakness (generalized) (M62.81);Difficulty in walking, not elsewhere classified (R26.2);Unsteadiness on feet (R26.81)     Time: 1410-1430 PT Time Calculation (min) (ACUTE ONLY): 20 min  Charges:  $Gait Training: 8-22 mins                     Aeriel Boulay, PT, GCS 01/25/21,2:39 PM

## 2021-01-25 NOTE — Evaluation (Signed)
Physical Therapy Evaluation Patient Details Name: Dwayne Smith MRN: [id number] DOB: November 02, 1945 Today's Date: 01/25/2021   History of Present Illness  Dwayne Smith is here for cervical myelopathy, 01/24/21 - C3-7 laminoplasty  Clinical Impression  Patient received in recliner, agrees to PT session. Patient reports weakness in R LE and R UE. He requires cues and supervision for sit to stand transfers, ambulated 200 feet with RW and min guard/supervision. No lob. Patient will continue to benefit from skilled PT while here to improve functional independence, endurance and safety.     Follow Up Recommendations No PT follow up    Equipment Recommendations       Recommendations for Other Services       Precautions / Restrictions Precautions Precautions: Fall Restrictions Weight Bearing Restrictions: No      Mobility  Bed Mobility               General bed mobility comments: not assessed, patient received and remained in recliner    Transfers Overall transfer level: Needs assistance Equipment used: Rolling walker (2 wheeled) Transfers: Sit to/from Stand Sit to Stand: Supervision            Ambulation/Gait Ambulation/Gait assistance: Supervision Gait Distance (Feet): 200 Feet Assistive device: Rolling walker (2 wheeled) Gait Pattern/deviations: Step-through pattern;Decreased step length - right;Decreased step length - left Gait velocity: decr   General Gait Details: patient ambulates safely, no lob, reports weakness  Stairs            Wheelchair Mobility    Modified Rankin (Stroke Patients Only)       Balance Overall balance assessment: Modified Independent                                           Pertinent Vitals/Pain Pain Assessment: Faces Faces Pain Scale: Hurts a little bit Pain Location: neck Pain Intervention(s): Monitored during session    Home Living Family/patient expects to be discharged to:: Private residence Living  Arrangements: Spouse/significant other Available Help at Discharge: Family;Available PRN/intermittently Type of Home: House Home Access: Ramped entrance     Home Layout: One level Home Equipment: Walker - 2 wheels;Cane - single point Additional Comments: was using walking stick prior to admission. He reports he has two walkers at home that he can use as needed    Prior Function Level of Independence: Independent with assistive device(s)         Comments: Patient takes care of his wife     Hand Dominance   Dominant Hand: Right    Extremity/Trunk Assessment   Upper Extremity Assessment Upper Extremity Assessment: RUE deficits/detail RUE Coordination: decreased gross motor    Lower Extremity Assessment Lower Extremity Assessment: RLE deficits/detail RLE Deficits / Details: continues to report weakness of R LE RLE Coordination: decreased gross motor    Cervical / Trunk Assessment Cervical / Trunk Assessment: Normal  Communication   Communication: No difficulties  Cognition Arousal/Alertness: Awake/alert Behavior During Therapy: WFL for tasks assessed/performed Overall Cognitive Status: Within Functional Limits for tasks assessed                                        General Comments      Exercises     Assessment/Plan    PT Assessment Patient needs continued  PT services  PT Problem List Decreased strength;Decreased mobility;Decreased activity tolerance;Decreased knowledge of use of DME       PT Treatment Interventions DME instruction;Therapeutic exercise;Gait training;Balance training;Stair training;Functional mobility training;Therapeutic activities;Patient/family education    PT Goals (Current goals can be found in the Care Plan section)  Acute Rehab PT Goals Patient Stated Goal: to return home, feel better PT Goal Formulation: With patient Time For Goal Achievement: 02/01/21 Potential to Achieve Goals: Good    Frequency 7X/week    Barriers to discharge        Co-evaluation               AM-PAC PT "6 Clicks" Mobility  Outcome Measure Help needed turning from your back to your side while in a flat bed without using bedrails?: A Little Help needed moving from lying on your back to sitting on the side of a flat bed without using bedrails?: A Little Help needed moving to and from a bed to a chair (including a wheelchair)?: A Little Help needed standing up from a chair using your arms (e.g., wheelchair or bedside chair)?: A Little Help needed to walk in hospital room?: A Little Help needed climbing 3-5 steps with a railing? : A Little 6 Click Score: 18    End of Session Equipment Utilized During Treatment: Gait belt Activity Tolerance: Patient tolerated treatment well Patient left: in chair;with call bell/phone within reach;with family/visitor present Nurse Communication: Mobility status PT Visit Diagnosis: Muscle weakness (generalized) (M62.81);Difficulty in walking, not elsewhere classified (R26.2)    Time: 9326-7124 PT Time Calculation (min) (ACUTE ONLY): 20 min   Charges:   PT Evaluation $PT Eval Moderate Complexity: 1 Mod PT Treatments $Gait Training: 8-22 mins        Ramanda Paules, PT, GCS 01/25/21,10:33 AM

## 2021-01-25 NOTE — Progress Notes (Signed)
    Attending Progress Note  Surgery - 01/24/21 - C3-7 laminoplasty  History: Dwayne Smith is here for cervical myelopathy  POD1: Doing well.  Has some pain.  Standing up at side of bed to urinate.  Arms and legs feeling good.  Physical Exam: Vitals:   01/25/21 0009 01/25/21 0454  BP: (!) 153/94 132/78  Pulse: 91 81  Resp: 16 16  Temp: (!) 97.5 F (36.4 C) (!) 97.4 F (36.3 C)  SpO2: 99% 99%    AA Ox3 CNI  Strength:5/5 throughout LLE and LUE except grip, which is 4/5 RUE diffusely 4-4+/5.  RLE 5/5  Dressing c/d/i with some spotting from surgery  Data:  No results for input(s): NA, K, CL, CO2, BUN, CREATININE, LABGLOM, GLUCOSE, CALCIUM in the last 168 hours. No results for input(s): AST, ALT, ALKPHOS in the last 168 hours.  Invalid input(s): TBILI   No results for input(s): WBC, HGB, HCT, PLT in the last 168 hours. No results for input(s): APTT, INR in the last 168 hours.       Other tests/results: none to review  Drain 100  Assessment/Plan:  Dwayne Smith is doing well from laminoplasty.  - mobilize - pain control - DVT prophylaxis - PTOT - Continue drain   Meade Maw MD, [facility] Department of Neurosurgery

## 2021-01-26 NOTE — Progress Notes (Signed)
Physical Therapy Treatment Patient Details Name: Dwayne Smith MRN: [id number] DOB: 12-25-1945 Today's Date: 01/26/2021    History of Present Illness NIEL PERETTI is here for cervical myelopathy, 01/24/21 - C3-7 laminoplasty    PT Comments    Patient received in recliner, agrees to PT session. Reports neck is stiff this morning. Patient is supervision for sit to stand. Ambulated 250 feet with RW and min guard. Slow, steady pace. Patient will continue to benefit from skilled PT while here to improve functional independence and strength for safe return home.     Follow Up Recommendations  Home health PT;Supervision - Intermittent     Equipment Recommendations  None recommended by PT    Recommendations for Other Services       Precautions / Restrictions Precautions Precautions: Fall;Cervical Precaution Comments: BLTA - pt with poor recall of precautions at start of session, able to recall 100% after additional instruction Restrictions Weight Bearing Restrictions: No    Mobility  Bed Mobility               General bed mobility comments: NT, up in recliner    Transfers Overall transfer level: Needs assistance Equipment used: Rolling walker (2 wheeled) Transfers: Sit to/from Stand Sit to Stand: Min guard         General transfer comment: cues for safety  Ambulation/Gait Ambulation/Gait assistance: Min guard;Min assist Gait Distance (Feet): 250 Feet Assistive device: Rolling walker (2 wheeled) Gait Pattern/deviations: Step-through pattern;Decreased step length - right;Decreased step length - left;Decreased stride length Gait velocity: decr   General Gait Details: Patient with unsteadiness initially during ambulation this afternoon. Required min assist due to lob,   Stairs             Wheelchair Mobility    Modified Rankin (Stroke Patients Only)       Balance Overall balance assessment: Needs assistance Sitting-balance support: Feet  supported Sitting balance-Leahy Scale: Good     Standing balance support: Bilateral upper extremity supported;During functional activity Standing balance-Leahy Scale: Fair Standing balance comment: reliant on RW and min assist for safety/balance                            Cognition Arousal/Alertness: Awake/alert Behavior During Therapy: WFL for tasks assessed/performed Overall Cognitive Status: Within Functional Limits for tasks assessed                                        Exercises Other Exercises Other Exercises: Pt provided with additioanl instruction in cervical precautions and how to maintain during ADL and IADL tasks, RW mgt, falls prevention, AE/DME Other Exercises: LE exercises: AP, seated marching, LAQ, hip abd/add, SLR x 10 in recliner.    General Comments        Pertinent Vitals/Pain Pain Assessment: 0-10 Pain Score: 4  Pain Location: neck Pain Descriptors / Indicators: Sore;Discomfort Pain Intervention(s): Monitored during session;Premedicated before session    Home Living                      Prior Function            PT Goals (current goals can now be found in the care plan section) Acute Rehab PT Goals Patient Stated Goal: to return home, feel better PT Goal Formulation: With patient Time For Goal Achievement: 02/01/21 Potential to Achieve Goals:  Good Progress towards PT goals: Progressing toward goals    Frequency    7X/week      PT Plan Current plan remains appropriate    Co-evaluation              AM-PAC PT "6 Clicks" Mobility   Outcome Measure  Help needed turning from your back to your side while in a flat bed without using bedrails?: A Little Help needed moving from lying on your back to sitting on the side of a flat bed without using bedrails?: A Little Help needed moving to and from a bed to a chair (including a wheelchair)?: A Little Help needed standing up from a chair using your arms  (e.g., wheelchair or bedside chair)?: A Little Help needed to walk in hospital room?: A Little Help needed climbing 3-5 steps with a railing? : A Little 6 Click Score: 18    End of Session Equipment Utilized During Treatment: Gait belt Activity Tolerance: Patient tolerated treatment well Patient left: with call bell/phone within reach;in chair Nurse Communication: Mobility status PT Visit Diagnosis: Muscle weakness (generalized) (M62.81);Difficulty in walking, not elsewhere classified (R26.2);Unsteadiness on feet (R26.81)     Time: 5732-2025 PT Time Calculation (min) (ACUTE ONLY): 15 min  Charges:  $Therapeutic Activity: 8-22 mins                     Dani Danis, PT, GCS 01/26/21,10:17 AM

## 2021-01-26 NOTE — Progress Notes (Signed)
    Attending Progress Note  Surgery - 01/24/21 - C3-7 laminoplasty  History: Dwayne Smith is here for cervical myelopathy  POD2: Doing well.  Pain under control. He did well with PT.  POD1: Doing well.  Has some pain.  Standing up at side of bed to urinate.  Arms and legs feeling good.  Physical Exam: Vitals:   01/26/21 0428 01/26/21 0841  BP: 135/74 134/71  Pulse: 82 81  Resp: 17 17  Temp: 97.9 F (36.6 C) 98 F (36.7 C)  SpO2: 97% 98%    AA Ox3 CNI  Strength:5/5 throughout LLE and LUE except grip, which is 4/5 RUE diffusely 4+/5.  RLE 5/5  Dressing c/d/i with some spotting from surgery  Data:  No results for input(s): NA, K, CL, CO2, BUN, CREATININE, LABGLOM, GLUCOSE, CALCIUM in the last 168 hours. No results for input(s): AST, ALT, ALKPHOS in the last 168 hours.  Invalid input(s): TBILI   No results for input(s): WBC, HGB, HCT, PLT in the last 168 hours. No results for input(s): APTT, INR in the last 168 hours.       Other tests/results: none to review  Drain unclear  Assessment/Plan:  Dwayne Smith is doing well from laminoplasty.  - mobilize - pain control - DVT prophylaxis - PTOT - Continue drain   Meade Maw MD, [facility] Department of Neurosurgery

## 2021-01-26 NOTE — Progress Notes (Signed)
Occupational Therapy Treatment Patient Details Name: Dwayne Smith MRN: [id number] DOB: Aug 23, 1946 Today's Date: 01/26/2021    History of present illness Dwayne Smith is here for cervical myelopathy, 01/24/21 - C3-7 laminoplasty   OT comments  Pt seen for OT tx this date. Pt seated in recliner, finishing breakfast, endorsing 6/10 posterior neck pain but recently had medication to address per pt. Pt instructed further in AE/DME for LB ADL tasks and cervical precautions + how to maintain during ADL and IADL tasks. Pt demo's improved recall after additional instruction and demonstration.  Pt requires CGA-PRN MIN A for LB ADL tasks + AE in order to maintain precautions, Sup-CGA for ADL transfers. Pt continues to benefit from skilled OT services while hospitalized to maximize recall and carryover of learned instruction to maximize safety/indep with ADL and ADL mobility.    Follow Up Recommendations  No OT follow up    Equipment Recommendations  Other (comment) Management consultant)    Recommendations for Other Services      Precautions / Restrictions Precautions Precautions: Fall;Cervical Precaution Comments: BLTA - pt with poor recall of precautions at start of session, able to recall 100% after additional instruction Restrictions Weight Bearing Restrictions: No       Mobility Bed Mobility               General bed mobility comments: NT, up in recliner    Transfers                      Balance                                           ADL either performed or assessed with clinical judgement   ADL Overall ADL's : Needs assistance/impaired                                       General ADL Comments: Pt requires CGA-PRN MIN A for LB ADL tasks + AE in order to maintain precautions, Sup-CGA for ADL transfers     Vision Baseline Vision/History: Wears glasses Wears Glasses: At all times Patient Visual Report: No change from baseline      Perception     Praxis      Cognition Arousal/Alertness: Awake/alert Behavior During Therapy: WFL for tasks assessed/performed Overall Cognitive Status: Within Functional Limits for tasks assessed                                          Exercises Other Exercises Other Exercises: Pt provided with additioanl instruction in cervical precautions and how to maintain during ADL and IADL tasks, RW mgt, falls prevention, AE/DME   Shoulder Instructions       General Comments      Pertinent Vitals/ Pain       Pain Assessment: 0-10 Pain Score: 6  Pain Location: neck Pain Descriptors / Indicators: Aching;Discomfort Pain Intervention(s): Limited activity within patient's tolerance;Monitored during session;Premedicated before session;Repositioned  Home Living  Prior Functioning/Environment              Frequency  Min 1X/week        Progress Toward Goals  OT Goals(current goals can now be found in the care plan section)  Progress towards OT goals: Progressing toward goals  Acute Rehab OT Goals Patient Stated Goal: to return home, feel better OT Goal Formulation: With patient Time For Goal Achievement: 02/08/21 Potential to Achieve Goals: Good  Plan Discharge plan remains appropriate;Frequency remains appropriate    Co-evaluation                 AM-PAC OT "6 Clicks" Daily Activity     Outcome Measure   Help from another person eating meals?: None Help from another person taking care of personal grooming?: None Help from another person toileting, which includes using toliet, bedpan, or urinal?: A Little Help from another person bathing (including washing, rinsing, drying)?: A Little Help from another person to put on and taking off regular upper body clothing?: None Help from another person to put on and taking off regular lower body clothing?: A Little 6 Click Score: 21    End of  Session    OT Visit Diagnosis: Other abnormalities of gait and mobility (R26.89);Muscle weakness (generalized) (M62.81)   Activity Tolerance Patient tolerated treatment well   Patient Left in chair;with call bell/phone within reach   Nurse Communication          Time: 6010-9323 OT Time Calculation (min): 15 min  Charges: OT General Charges $OT Visit: 1 Visit OT Treatments $Self Care/Home Management : 8-22 mins  Hanley Hays, MPH, MS, OTR/L ascom [phone] 01/26/21, 9:57 AM

## 2021-01-27 MED ORDER — ACETAMINOPHEN 325 MG PO TABS: 650.0000 mg | ORAL_TABLET | ORAL | 0 refills | Status: AC | PRN

## 2021-01-27 MED ORDER — OXYCODONE HCL 5 MG PO TABS: 5.0000 mg | ORAL_TABLET | ORAL | 0 refills | Status: AC | PRN

## 2021-01-27 MED ORDER — METHOCARBAMOL 500 MG PO TABS: 500.0000 mg | ORAL_TABLET | Freq: Four times a day (QID) | ORAL | 0 refills | Status: AC | PRN

## 2021-01-27 NOTE — Progress Notes (Signed)
Physical Therapy Treatment Patient Details Name: Dwayne Smith MRN: [id number] DOB: [birth year] Today's Date: 01/27/2021    History of Present Illness Dwayne Smith is here for cervical myelopathy, 01/24/21 - C3-7 laminoplasty    PT Comments    Pt ready for session.  Bed mobility with good rolling and no assist needed.  Cues for hand placements to stand and is able to complete lap with RW and min guard/assist.  Slow gait seeming hesitant at times but no outside assist needed.  Encouraged +1 assist for general safety initially upon discharge.  No further questions regarding home.  Will need RW prior to DC.   Follow Up Recommendations  Home health PT;Supervision - Intermittent     Equipment Recommendations  None recommended by PT    Recommendations for Other Services       Precautions / Restrictions Precautions Precautions: Fall;Cervical Precaution Comments: BLTA - pt with poor recall of precautions at start of session, able to recall 100% after additional instruction Restrictions Weight Bearing Restrictions: No    Mobility  Bed Mobility Overal bed mobility: Needs Assistance Bed Mobility: Supine to Sit;Sit to Supine     Supine to sit: Supervision          Transfers Overall transfer level: Needs assistance Equipment used: Rolling walker (2 wheeled) Transfers: Sit to/from Stand Sit to Stand: Min guard         General transfer comment: cues for safety  Ambulation/Gait Ambulation/Gait assistance: Min guard;Min assist Gait Distance (Feet): 180 Feet Assistive device: Rolling walker (2 wheeled) Gait Pattern/deviations: Step-through pattern;Decreased step length - right;Decreased step length - left;Decreased stride length Gait velocity: decr   General Gait Details: Patient with unsteadiness initially during ambulation this afternoon. Pt seems generally hesitant but does well with gait.  Encouraged to have +1 assist at home for safety.   Stairs              Wheelchair Mobility    Modified Rankin (Stroke Patients Only)       Balance Overall balance assessment: Needs assistance Sitting-balance support: Feet supported Sitting balance-Leahy Scale: Good     Standing balance support: Bilateral upper extremity supported;During functional activity Standing balance-Leahy Scale: Fair Standing balance comment: reliant on RW and min guard for safety/balance                            Cognition Arousal/Alertness: Awake/alert Behavior During Therapy: WFL for tasks assessed/performed Overall Cognitive Status: Within Functional Limits for tasks assessed                                        Exercises      General Comments        Pertinent Vitals/Pain Pain Assessment: 0-10 Pain Score: 3  Pain Location: neck Pain Descriptors / Indicators: Sore;Discomfort Pain Intervention(s): Monitored during session;Repositioned    Home Living                      Prior Function            PT Goals (current goals can now be found in the care plan section) Progress towards PT goals: Progressing toward goals    Frequency    7X/week      PT Plan Current plan remains appropriate    Co-evaluation  AM-PAC PT "6 Clicks" Mobility   Outcome Measure  Help needed turning from your back to your side while in a flat bed without using bedrails?: A Little Help needed moving from lying on your back to sitting on the side of a flat bed without using bedrails?: A Little Help needed moving to and from a bed to a chair (including a wheelchair)?: A Little Help needed standing up from a chair using your arms (e.g., wheelchair or bedside chair)?: A Little Help needed to walk in hospital room?: A Little Help needed climbing 3-5 steps with a railing? : A Little 6 Click Score: 18    End of Session Equipment Utilized During Treatment: Gait belt Activity Tolerance: Patient tolerated treatment  well Patient left: with call bell/phone within reach;in chair Nurse Communication: Mobility status PT Visit Diagnosis: Muscle weakness (generalized) (M62.81);Difficulty in walking, not elsewhere classified (R26.2);Unsteadiness on feet (R26.81)     Time: 6948-5462 PT Time Calculation (min) (ACUTE ONLY): 12 min  Charges:  $Gait Training: 8-22 mins                    Chesley Noon, PTA 01/27/21, 9:33 AM

## 2021-01-27 NOTE — Plan of Care (Signed)
  Problem: Education: Goal: Knowledge of General Education information will improve Description: Including pain rating scale, medication(s)/side effects and non-pharmacologic comfort measures Outcome: Progressing   Problem: Activity: Goal: Ability to tolerate increased activity will improve Outcome: Progressing   Problem: Clinical Measurements: Goal: Complications related to the disease process, condition or treatment will be avoided or minimized Outcome: Progressing   Problem: Skin Integrity: Goal: Demonstration of wound healing without infection will improve Outcome: Progressing

## 2021-01-27 NOTE — Progress Notes (Signed)
Vss, pt IV's removed both sites are clean, dry and intact. Went over discharge packet with pt he verbalized his understanding through out the process. Pt transported via W/C with all of his belongings he came with where his son was waiting.

## 2021-01-27 NOTE — Progress Notes (Signed)
    Attending Progress Note  Surgery - 01/24/21 - C3-7 laminoplasty  History: Dwayne Smith is here for cervical myelopathy  POD3: no new issues  POD2: Doing well.  Pain under control. He did well with PT.  POD1: Doing well.  Has some pain.  Standing up at side of bed to urinate.  Arms and legs feeling good.  Physical Exam: Vitals:   01/26/21 2139 01/27/21 0633  BP: (!) 149/77 (!) 159/77  Pulse: (!) 103 100  Resp: 16 17  Temp: 97.6 F (36.4 C) 99.2 F (37.3 C)  SpO2: 98% 95%    AA Ox3 CNI  Strength:5/5 throughout LLE and LUE except grip, which is 4/5 RUE diffusely 4+/5.  RLE 5/5  Dressing c/d/i with some spotting from surgery  Data:  No results for input(s): NA, K, CL, CO2, BUN, CREATININE, LABGLOM, GLUCOSE, CALCIUM in the last 168 hours. No results for input(s): AST, ALT, ALKPHOS in the last 168 hours.  Invalid input(s): TBILI   No results for input(s): WBC, HGB, HCT, PLT in the last 168 hours. No results for input(s): APTT, INR in the last 168 hours.       Other tests/results: none to review  Drain removed  Assessment/Plan:  Dwayne Smith is doing well from laminoplasty.  - mobilize - pain control - DVT prophylaxis - PTOT    Meade Maw MD, [facility] Department of Neurosurgery

## 2021-01-27 NOTE — Discharge Summary (Signed)
Physician Discharge Summary  Patient ID: Dwayne Smith MRN: [id number] DOB/AGE: November 03, 1945 75 y.o.  Admit date: 01/24/2021 Discharge date: 01/27/2021  Admission Diagnoses: cervical myelopathy  Discharge Diagnoses:  Active Problems:   Cervical myelopathy [facility])   Discharged Condition: good  Hospital Course: Mr. [name] was admitted for cervical myelopathy.  He did well with surgery, and was stable for discharge on POD3.  Consults: None  Significant Diagnostic Studies: radiology: X-Ray: good palcement of implants  Treatments: surgery: C3-7 laminoplasty  Discharge Exam: Blood pressure (!) 150/83, pulse (!) 101, temperature 98 F (36.7 C), temperature source Oral, resp. rate 16, height 5\' 7"  (1.702 m), weight 78.5 kg, SpO2 95 %. General appearance: alert and cooperative  CNI Ox3 MAEW with slight RUE weakness   Disposition: Discharge disposition: 01-Home or Self Care       Discharge Instructions    Incentive spirometry RT   Complete by: As directed      Allergies as of 01/27/2021   No Known Allergies     Medication List    TAKE these medications   acetaminophen 325 MG tablet Commonly known as: TYLENOL Take 2 tablets (650 mg total) by mouth every 4 (four) hours as needed for mild pain ((score 1 to 3) or temp > 100.5).   aspirin 325 MG tablet Take 325 mg by mouth daily. Notes to patient: Ok to restart 7 days after surgery   finasteride 5 MG tablet Commonly known as: PROSCAR Take 5 mg by mouth daily.   gabapentin 300 MG capsule Commonly known as: NEURONTIN Take 300 mg by mouth 3 (three) times daily.   methocarbamol 500 MG tablet Commonly known as: ROBAXIN Take 1 tablet (500 mg total) by mouth every 6 (six) hours as needed for muscle spasms.   nabumetone 500 MG tablet Commonly known as: RELAFEN Take 500 mg by mouth 2 (two) times daily.   oxyCODONE 5 MG immediate release tablet Commonly known as: Oxy IR/ROXICODONE Take 1 tablet (5 mg total) by mouth every 3  (three) hours as needed for moderate pain ((score 4 to 6)).   simvastatin 20 MG tablet Commonly known as: ZOCOR Take 20 mg by mouth at bedtime.   terazosin 5 MG capsule Commonly known as: HYTRIN Take 5 mg by mouth at bedtime.            Durable Medical Equipment  (From admission, onward)         Start     Ordered   01/27/21 0844  For home use only DME Walker rolling  Once       Question Answer Comment  Walker: With [place] Wheels   Patient needs a walker to treat with the following condition Cervical myelopathy ([place])      01/27/21 0843          Follow-up Information    Meade Maw, MD Follow up in 2 week(s).   Specialty: Neurosurgery Why: already scheduled Contact information: [place] [address] [phone]               Signed: Meade Maw 01/27/2021, 8:48 AM

## 2021-02-01 ENCOUNTER — Encounter: Payer: Self-pay | Admitting: Neurosurgery

## 2021-03-14 ENCOUNTER — Ambulatory Visit: Payer: Medicare PPO | Admitting: Dermatology

## 2021-06-06 ENCOUNTER — Other Ambulatory Visit: Payer: Self-pay

## 2021-06-06 ENCOUNTER — Ambulatory Visit (INDEPENDENT_AMBULATORY_CARE_PROVIDER_SITE_OTHER): Payer: Medicare PPO | Admitting: Dermatology

## 2021-06-06 DIAGNOSIS — L821 Other seborrheic keratosis: Secondary | ICD-10-CM

## 2021-06-06 DIAGNOSIS — L578 Other skin changes due to chronic exposure to nonionizing radiation: Secondary | ICD-10-CM | POA: Diagnosis not present

## 2021-06-06 DIAGNOSIS — L219 Seborrheic dermatitis, unspecified: Secondary | ICD-10-CM

## 2021-06-06 DIAGNOSIS — L570 Actinic keratosis: Secondary | ICD-10-CM

## 2021-06-06 DIAGNOSIS — L820 Inflamed seborrheic keratosis: Secondary | ICD-10-CM

## 2021-06-06 MED ORDER — KETOCONAZOLE 2 % EX CREA: TOPICAL_CREAM | CUTANEOUS | 3 refills | Status: DC

## 2021-06-06 MED ORDER — HYDROCORTISONE 2.5 % EX CREA: TOPICAL_CREAM | CUTANEOUS | 3 refills | Status: AC

## 2021-06-06 NOTE — Progress Notes (Signed)
Follow-Up Visit   Subjective  Dwayne Smith is a 75 y.o. male who presents for the following: Actinic Keratosis (Of the scalp, face, and ears - S/P PDT on the scalp ). The patient also has a rash on his scalp.  He has a few rough areas he noticed.  The following portions of the chart were reviewed this encounter and updated as appropriate:   Tobacco  Allergies  Meds  Problems  Med Hx  Surg Hx  Fam Hx     Review of Systems:  No other skin or systemic complaints except as noted in HPI or Assessment and Plan.  Objective  Well appearing patient in no apparent distress; mood and affect are within normal limits.  A focused examination was performed including the face, scalp, and ears. Relevant physical exam findings are noted in the Assessment and Plan.  R scalp x 1 Erythematous thin papules/macules with gritty scale.   R post scalp x 1, scalp x 1 (2) Erythematous keratotic or waxy stuck-on papule or plaque.    Assessment & Plan  AK (actinic keratosis) R scalp x 1  Destruction of lesion - R scalp x 1 Complexity: simple   Destruction method: cryotherapy   Informed consent: discussed and consent obtained   Timeout:  patient name, date of birth, surgical site, and procedure verified Lesion destroyed using liquid nitrogen: Yes   Region frozen until ice ball extended beyond lesion: Yes   Outcome: patient tolerated procedure well with no complications   Post-procedure details: wound care instructions given    Inflamed seborrheic keratosis R post scalp x 1, scalp x 1  Destruction of lesion - R post scalp x 1, scalp x 1 Complexity: simple   Destruction method: cryotherapy   Informed consent: discussed and consent obtained   Timeout:  patient name, date of birth, surgical site, and procedure verified Lesion destroyed using liquid nitrogen: Yes   Region frozen until ice ball extended beyond lesion: Yes   Outcome: patient tolerated procedure well with no complications    Post-procedure details: wound care instructions given    Seborrheic dermatitis Scalp  Seborrheic Dermatitis  -  is a chronic persistent rash characterized by pinkness and scaling most commonly of the mid face but also can occur on the scalp (dandruff), ears; mid chest, mid back and groin.  It tends to be exacerbated by stress and cooler weather.  People who have neurologic disease may experience new onset or exacerbation of existing seborrheic dermatitis.  The condition is not curable but treatable and can be controlled.  Start Ketoconazole 2% cream QHS on M, W, F and HC 2.5% cream QHS on T, Th, and Sat.   hydrocortisone 2.5 % cream - Scalp Apply to aa's scalp QHS on Tuesday, Thursday, and Saturday.  ketoconazole (NIZORAL) 2 % cream - Scalp Apply to aa's scalp QHS on Monday, Wednesday, and Friday.  Actinic Damage - chronic, secondary to cumulative UV radiation exposure/sun exposure over time - diffuse scaly erythematous macules with underlying dyspigmentation - Recommend daily broad spectrum sunscreen SPF 30+ to sun-exposed areas, reapply every 2 hours as needed.  - Recommend staying in the shade or wearing long sleeves, sun glasses (UVA+UVB protection) and wide brim hats (4-inch brim around the entire circumference of the hat). - Call for new or changing lesions.  Seborrheic Keratoses - Stuck-on, waxy, tan-brown papules and/or plaques  - Benign-appearing - Discussed benign etiology and prognosis. - Observe - Call for any changes  Return in about  6 months (around 12/04/2021) for  AK and SD follow up .  Luther Redo, CMA, am acting as scribe for Sarina Ser, MD .  Documentation: I have reviewed the above documentation for accuracy and completeness, and I agree with the above.  Sarina Ser, MD

## 2021-06-06 NOTE — Patient Instructions (Signed)

## 2021-06-08 ENCOUNTER — Encounter: Payer: Self-pay | Admitting: Dermatology

## 2021-11-20 ENCOUNTER — Encounter: Payer: Self-pay | Admitting: Ophthalmology

## 2021-11-22 NOTE — Discharge Instructions (Signed)

## 2021-11-27 ENCOUNTER — Encounter
Admission: RE | Disposition: A | Discharge status: Home / Self Care | Payer: Self-pay | Source: Ambulatory Visit | Attending: Ophthalmology

## 2021-11-27 ENCOUNTER — Ambulatory Visit: Payer: Medicare PPO | Admitting: Anesthesiology

## 2021-11-27 ENCOUNTER — Encounter: Payer: Self-pay | Admitting: Ophthalmology

## 2021-11-27 ENCOUNTER — Other Ambulatory Visit: Payer: Self-pay

## 2021-11-27 ENCOUNTER — Ambulatory Visit
Admission: RE | Admit: 2021-11-27 | Discharge: 2021-11-27 | Disposition: A | Discharge status: Home / Self Care | Payer: Medicare PPO | Source: Ambulatory Visit | Attending: Ophthalmology | Admitting: Ophthalmology

## 2021-11-27 DIAGNOSIS — Z87891 Personal history of nicotine dependence: Secondary | ICD-10-CM | POA: Diagnosis not present

## 2021-11-27 DIAGNOSIS — M1990 Unspecified osteoarthritis, unspecified site: Secondary | ICD-10-CM | POA: Insufficient documentation

## 2021-11-27 DIAGNOSIS — H2512 Age-related nuclear cataract, left eye: Secondary | ICD-10-CM | POA: Insufficient documentation

## 2021-11-27 DIAGNOSIS — I10 Essential (primary) hypertension: Secondary | ICD-10-CM | POA: Insufficient documentation

## 2021-11-27 HISTORY — PX: CATARACT EXTRACTION W/PHACO: SHX586

## 2021-11-27 SURGERY — PHACOEMULSIFICATION, CATARACT, WITH IOL INSERTION
Anesthesia: Monitor Anesthesia Care | Site: Eye | Laterality: Left

## 2021-11-27 MED ORDER — SIGHTPATH DOSE#1 BSS IO SOLN
INTRAOCULAR | Status: DC | PRN
  Administered 2021-11-27: 92 mL via OPHTHALMIC

## 2021-11-27 MED ORDER — SIGHTPATH DOSE#1 NA CHONDROIT SULF-NA HYALURON 40-17 MG/ML IO SOLN
INTRAOCULAR | Status: DC | PRN
  Administered 2021-11-27: 1 mL via INTRAOCULAR

## 2021-11-27 MED ORDER — MOXIFLOXACIN HCL 0.5 % OP SOLN
OPHTHALMIC | Status: DC | PRN
  Administered 2021-11-27: 0.2 mL via OPHTHALMIC

## 2021-11-27 MED ORDER — FENTANYL CITRATE (PF) 100 MCG/2ML IJ SOLN
INTRAMUSCULAR | Status: DC | PRN
  Administered 2021-11-27 (×2): 50 ug via INTRAVENOUS

## 2021-11-27 MED ORDER — LACTATED RINGERS IV SOLN: INTRAVENOUS | Status: DC

## 2021-11-27 MED ORDER — SIGHTPATH DOSE#1 BSS IO SOLN
INTRAOCULAR | Status: DC | PRN
  Administered 2021-11-27: 2 mL

## 2021-11-27 MED ORDER — SIGHTPATH DOSE#1 BSS IO SOLN
INTRAOCULAR | Status: DC | PRN
  Administered 2021-11-27: 15 mL via INTRAOCULAR

## 2021-11-27 MED ORDER — MIDAZOLAM HCL 2 MG/2ML IJ SOLN
INTRAMUSCULAR | Status: DC | PRN
Start: 2021-11-27 — End: 2021-11-27
  Administered 2021-11-27: 2 mg via INTRAVENOUS

## 2021-11-27 MED ORDER — ONDANSETRON HCL 4 MG/2ML IJ SOLN: 4.0000 mg | Freq: Once | INTRAMUSCULAR | Status: DC | PRN

## 2021-11-27 MED ORDER — ACETAMINOPHEN 160 MG/5ML PO SOLN: 975.0000 mg | Freq: Once | ORAL | Status: DC | PRN

## 2021-11-27 MED ORDER — TETRACAINE HCL 0.5 % OP SOLN
1.0000 [drp] | OPHTHALMIC | Status: DC | PRN
  Administered 2021-11-27 (×3): 1 [drp] via OPHTHALMIC

## 2021-11-27 MED ORDER — ACETAMINOPHEN 500 MG PO TABS: 1000.0000 mg | ORAL_TABLET | Freq: Once | ORAL | Status: DC | PRN

## 2021-11-27 MED ORDER — ARMC OPHTHALMIC DILATING DROPS
1.0000 "application " | OPHTHALMIC | Status: DC | PRN
  Administered 2021-11-27 (×3): 1 via OPHTHALMIC

## 2021-11-27 MED ORDER — BRIMONIDINE TARTRATE-TIMOLOL 0.2-0.5 % OP SOLN
OPHTHALMIC | Status: DC | PRN
  Administered 2021-11-27: 1 [drp] via OPHTHALMIC

## 2021-11-27 SURGICAL SUPPLY — 14 items
CANNULA ANT/CHMB 27G (MISCELLANEOUS) IMPLANT
CANNULA ANT/CHMB 27GA (MISCELLANEOUS) IMPLANT
CATARACT SUITE SIGHTPATH (MISCELLANEOUS) ×3 IMPLANT
FEE CATARACT SUITE SIGHTPATH (MISCELLANEOUS) ×1 IMPLANT
GLOVE SURG ENC TEXT LTX SZ8 (GLOVE) ×3 IMPLANT
GLOVE SURG TRIUMPH 8.0 PF LTX (GLOVE) ×3 IMPLANT
LENS IOL EYHANCE TORIC II 17.5 ×3 IMPLANT
LENS IOL EYHANCE TRC 300 17.5 IMPLANT
LENS IOL EYHNC TORIC 300 17.5 ×1 IMPLANT
NDL FILTER BLUNT 18X1 1/2 (NEEDLE) ×1 IMPLANT
NEEDLE FILTER BLUNT 18X 1/2SAF (NEEDLE) ×2
NEEDLE FILTER BLUNT 18X1 1/2 (NEEDLE) ×1 IMPLANT
SYR 3ML LL SCALE MARK (SYRINGE) ×3 IMPLANT
WATER STERILE IRR 250ML POUR (IV SOLUTION) ×3 IMPLANT

## 2021-11-27 NOTE — Anesthesia Procedure Notes (Signed)
Procedure Name: [place] ?Date/Time: 11/27/2021 9:03 AM ?Performed by: Jeannene Patella, CRNA ?Pre-anesthesia Checklist: Patient identified, Emergency Drugs available, Suction available, Timeout performed and Patient being monitored ?Patient Re-evaluated:Patient Re-evaluated prior to induction ?Oxygen Delivery Method: Nasal cannula ?Placement Confirmation: positive ETCO2 ? ? ? ? ?

## 2021-11-27 NOTE — Anesthesia Postprocedure Evaluation (Signed)
Anesthesia Post Note ? ?Patient: LEMAN MARTINEK ? ?Procedure(s) Performed: CATARACT EXTRACTION PHACO AND INTRAOCULAR LENS PLACEMENT (IOC) LEFT TORIC LENS 28.11 02:15.9 (Left: Eye) ? ? ?  ?Patient location during evaluation: PACU ?Anesthesia Type: MAC ?Level of consciousness: awake and alert ?Pain management: pain level controlled ?Vital Signs Assessment: post-procedure vital signs reviewed and stable ?Respiratory status: spontaneous breathing, nonlabored ventilation and respiratory function stable ?Cardiovascular status: stable and blood pressure returned to baseline ?Postop Assessment: no apparent nausea or vomiting ?Anesthetic complications: no ? ? ?No notable events documented. ? ?April Manson ? ? ? ? ? ?

## 2021-11-27 NOTE — Transfer of Care (Signed)
Immediate Anesthesia Transfer of Care Note ? ?Patient: Dwayne Smith ? ?Procedure(s) Performed: CATARACT EXTRACTION PHACO AND INTRAOCULAR LENS PLACEMENT (IOC) LEFT TORIC LENS 28.11 02:15.9 (Left: Eye) ? ?Patient Location: PACU ? ?Anesthesia Type: MAC ? ?Level of Consciousness: awake, alert  and patient cooperative ? ?Airway and Oxygen Therapy: Patient Spontanous Breathing and Patient connected to supplemental oxygen ? ?Post-op Assessment: Post-op Vital signs reviewed, Patient's Cardiovascular Status Stable, Respiratory Function Stable, Patent Airway and No signs of Nausea or vomiting ? ?Post-op Vital Signs: Reviewed and stable ? ?Complications: No notable events documented. ? ?

## 2021-11-27 NOTE — Anesthesia Preprocedure Evaluation (Signed)
Anesthesia Evaluation  ?Patient identified by MRN, date of birth, ID band ?Patient awake ? ? ? ?Reviewed: ?Allergy & Precautions, H&P , NPO status , Patient's Chart, lab work & pertinent test results, reviewed documented beta blocker date and time  ? ?History of Anesthesia Complications ?(+) PONV and history of anesthetic complications ? ?Airway ?Mallampati: II ? ?TM Distance: >3 FB ?Neck ROM: full ? ? ? Dental ?no notable dental hx. ? ?  ?Pulmonary ?neg pulmonary ROS, former smoker,  ?  ?Pulmonary exam normal ?breath sounds clear to auscultation ? ? ? ? ? ? Cardiovascular ?Exercise Tolerance: Good ?hypertension, + DVT  ?negative cardio ROS ? ? ?Rhythm:regular Rate:Normal ? ? ?  ?Neuro/Psych ?Bell's palsy history ?negative neurological ROS ? negative psych ROS  ? GI/Hepatic ?negative GI ROS, Neg liver ROS, GERD  ,  ?Endo/Other  ?negative endocrine ROS ? Renal/GU ?negative Renal ROS  ?negative genitourinary ?  ?Musculoskeletal ? ?(+) Arthritis ,  ? Abdominal ?  ?Peds ? Hematology ?negative hematology ROS ?(+)   ?Anesthesia Other Findings ? ? Reproductive/Obstetrics ?negative OB ROS ? ?  ? ? ? ? ? ? ? ? ? ? ? ? ? ?  ?  ? ? ? ? ? ? ? ? ?Anesthesia Physical ?Anesthesia Plan ? ?ASA: 2 ? ?Anesthesia Plan: MAC  ? ?Post-op Pain Management:   ? ?Induction:  ? ?PONV Risk Score and Plan: 2 and TIVA, Midazolam and Treatment may vary due to age or medical condition ? ?Airway Management Planned:  ? ?Additional Equipment:  ? ?Intra-op Plan:  ? ?Post-operative Plan:  ? ?Informed Consent: I have reviewed the patients History and Physical, chart, labs and discussed the procedure including the risks, benefits and alternatives for the proposed anesthesia with the patient or authorized representative who has indicated his/her understanding and acceptance.  ? ? ? ?Dental Advisory Given ? ?Plan Discussed with: CRNA ? ?Anesthesia Plan Comments:   ? ? ? ? ? ? ?Anesthesia Quick Evaluation ? ?

## 2021-11-27 NOTE — Op Note (Signed)
PREOPERATIVE DIAGNOSIS:  Nuclear sclerotic cataract of the left eye. ?  ?POSTOPERATIVE DIAGNOSIS:  Nuclear sclerotic cataract of the left eye. ?  ?OPERATIVE PROCEDURE: Procedure(s): ?CATARACT EXTRACTION PHACO AND INTRAOCULAR LENS PLACEMENT (IOC) LEFT TORIC LENS 28.11 02:15.9 ?  ?SURGEON:  Birder Robson, MD. ?  ?ANESTHESIA: ?1.      Managed anesthesia care. ?2.     0.46m os Shugarcaine was instilled following the paracentesis ?[email or web address]@ ?  ?COMPLICATIONS:  None. ?  ?TECHNIQUE:   Stop and chop  ?  ?DESCRIPTION OF PROCEDURE:  The patient was examined and consented in the preoperative holding area where the aforementioned topical anesthesia was applied to the left eye.  The patient was brought back to the Operating Room where he was sat upright on the gurney and given a target to fixate upon while the eye was marked at the 3:00 and 9:00 position.  The patient was then reclined on the operating table.  The eye was prepped and draped in the usual sterile ophthalmic fashion and a lid speculum was placed. A paracentesis was created with the side port blade and the anterior chamber was filled with viscoelastic. A near clear corneal incision was performed with the steel keratome. A continuous curvilinear capsulorrhexis was performed with a cystotome followed by the capsulorrhexis forceps. Hydrodissection and hydrodelineation were carried out with BSS on a blunt cannula. The lens was removed in a stop and chop technique and the remaining cortical material was removed with the irrigation-aspiration handpiece. The eye was inflated with viscoelastic and the ZCT lens was placed in the eye and rotated to within a few degrees of the predetermined orientation.  The remaining viscoelastic was removed from the eye.  The Sinskey hook was used to rotate the toric lens into its final resting place at 152 degrees.  0.1 ml of Vigamox was placed in the anterior chamber. The eye was inflated to a physiologic pressure and found to be  watertight.  The eye was dressed with Vigamox and Combigan The patient was given protective glasses to wear throughout the day and a shield with which to sleep tonight. The patient was also given drops with which to begin a drop regimen today and will follow-up with me in one day. ?Implant Name Type Inv. Item Serial No. Manufacturer Lot No. LRB No. Used Action  ?Eyhnace Toric II IOL [id number] 17.5 D Intraocular Lens  [id number]  Left 1 Implanted  ? ?Procedure(s): ?CATARACT EXTRACTION PHACO AND INTRAOCULAR LENS PLACEMENT (IOC) LEFT TORIC LENS 28.11 02:15.9 (Left) ? ?Electronically signed: WBirder Robson3/7/20239:23 AM   ?

## 2021-11-27 NOTE — H&P (Signed)
[place]  ? ?Primary Care Physician:  Idelle Crouch, MD ?Ophthalmologist: Dr. George Ina ? ?Pre-Procedure History & Physical: ?HPI:  Dwayne Smith is a 76 y.o. male here for cataract surgery. ?  ?Past Medical History:  ?Diagnosis Date  ? Arthritis   ? Bell's palsy   ? BPH (benign prostatic hyperplasia)   ? Cataract cortical, senile   ? DVT of lower extremity (deep venous thrombosis) ([place])   ? Dysplastic nevus 02/04/2019  ? left low back 6.0 cm lat to spine, moderate atypia   ? GERD (gastroesophageal reflux disease)   ? Hyperglycemia   ? PONV (postoperative nausea and vomiting)   ? shoulder surgery at [facility]  ? Precancerous skin lesion   ? PVD (peripheral vascular disease) ([place])   ? ? ?Past Surgical History:  ?Procedure Laterality Date  ? colonoscopy with polypectomy    ? COLONOSCOPY WITH PROPOFOL N/A 09/04/2018  ? Procedure: COLONOSCOPY WITH PROPOFOL;  Surgeon: Lollie Sails, MD;  Location[facility] ENDOSCOPY;  Service: Endoscopy;  Laterality: N/A;  ? ESOPHAGOGASTRODUODENOSCOPY (EGD) WITH PROPOFOL N/A 09/04/2018  ? Procedure: ESOPHAGOGASTRODUODENOSCOPY (EGD) WITH PROPOFOL;  Surgeon: Lollie Sails, MD;  Location: [facility] ENDOSCOPY;  Service: Endoscopy;  Laterality: N/A;  ? HERNIA REPAIR  [id number]  ? umbilical  ? SHOULDER ARTHROS[facility] ROT[facility]FF [facility]  ? SHOULD[facility] SURGERY Right 1990  ? bone spur  ? Vitreous hemorrhage    ? ? ?Prior to Admission medications   ?Medication Sig Start Date End Date Taking? Authorizing Provider  ?acetaminophen (TYLENOL) 325 MG tablet Take 2 tablets (650 mg total) by mouth every 4 (four) hours as needed for mild pain ((score 1 to 3) or temp > 100.5). 01/27/21  Yes Meade Maw, MD  ?amLODipine-benazepril (LOTREL) 5-10 MG capsule Take 1 capsule by mouth daily.   Yes [provider]  ?aspirin 325 MG tablet Take 325 mg by mouth daily.   Yes [provider]  ?finasteride (PROSCAR) 5 MG tablet Take 5 mg by mouth daily.   Yes [provider]  ?gabapentin (NEURONTIN) 400 MG capsule  05/07/21  Yes [provider]  ?hydrocortisone 2.5 % cream Apply to aa's scalp QHS on Tuesday, Thursday, and Saturday. 06/06/21  Yes Ralene Bathe, MD  ?ketoconazole (NIZORAL) 2 % cream Apply to aa's scalp QHS on Monday, Wednesday, and Friday. 06/06/21  Yes Ralene Bathe, MD  ?methocarbamol (ROBAXIN) 500 MG tablet Take 1 tablet (500 mg total) by mouth every 6 (six) hours as needed for muscle spasms. 01/27/21  Yes Meade Maw, MD  ?nabumetone (RELAFEN) 500 MG tablet Take 500 mg by mouth 2 (two) times daily.   Yes [provider]  ?oxyCODONE (OXY IR/ROXICODONE) 5 MG immediate release tablet Take 1 tablet (5 mg total) by mouth every 3 (three) hours as needed for moderate pain ((score 4 to 6)). 01/27/21  Yes Meade Maw, MD  ?simvastatin (ZOCOR) 20 MG tablet Take 20 mg by mouth at bedtime.   Yes [provider]  ?tadalafil (CIALIS) 5 MG tablet Take 5 mg by mouth daily as needed for erectile dysfunction.   Yes [provider]  ?terazosin (HYTRIN) 5 MG capsule Take 5 mg by mouth at bedtime.   Yes [provider]  ? ? ?Allergies as of 10/30/2021  ? (No Known Allergies)  ? ? ?History reviewed. No pertinent family history. ? ?Social History  ? ?Socioeconomic History  ? Marital status: Married  ?  Spouse name:  Not on file  ? Number of children: Not on file  ? Years of education: Not on file  ? Highest education level: Not on file  ?Occupational History  ? Not on file  ?Tobacco Use  ? Smoking status: Former  ?  Types: Cigarettes  ?  Quit date: 53  ?  Years since quitting: 10.2  ? Smokeless tobacco: Never  ?Vaping Use  ? Vaping Use: Never used  ?Substance and Sexual Activity  ? Alcohol use: Not Currently  ?  Alcohol/week: 7.0 standard drinks  ?  Types: 7 Cans of beer per week  ? Drug use: No  ? Sexual activity: Not on file  ?Other Topics Concern  ? Not on file  ?Social History Narrative  ? Not on file  ? ?Social  Determinants of Health  ? ?Financial Resource Strain: Not on file  ?Food Insecurity: Not on file  ?Transportation Needs: Not on file  ?Physical Activity: Not on file  ?Stress: Not on file  ?Social Connections: Not on file  ?Intimate Partner Violence: Not on file  ? ? ?Review of Systems: ?See HPI, otherwise negative ROS ? ?Physical Exam: ?BP (!) 155/86   Pulse 85   Temp 98.1 ?F (36.7 ?C) (Temporal)   Resp (!) 21   Ht '5\' 7"'$  (1.702 m)   Wt 76.2 kg   SpO2 98%   BMI 26.30 kg/m?  ?General:   Alert, cooperative in NAD ?Head:  Normocephalic and atraumatic. ?Respiratory:  Normal work of breathing. ?Cardiovascular:  RRR ? ?Impression/Plan: ?Dwayne Smith is here for cataract surgery. ? ?Risks, benefits, limitations, and alternatives regarding cataract surgery have been reviewed with the patient.  Questions have been answered.  All parties agreeable. ? ? ?Birder Robson, MD  11/27/2021, 8:56 AM ? ? ?

## 2021-11-28 ENCOUNTER — Encounter: Payer: Self-pay | Admitting: Ophthalmology

## 2021-12-05 NOTE — Discharge Instructions (Signed)

## 2021-12-10 ENCOUNTER — Ambulatory Visit: Payer: Medicare PPO | Admitting: Dermatology

## 2021-12-11 ENCOUNTER — Ambulatory Visit: Payer: Medicare PPO | Admitting: Anesthesiology

## 2021-12-11 ENCOUNTER — Encounter: Payer: Self-pay | Admitting: Ophthalmology

## 2021-12-11 ENCOUNTER — Other Ambulatory Visit: Payer: Self-pay

## 2021-12-11 ENCOUNTER — Ambulatory Visit
Admission: RE | Admit: 2021-12-11 | Discharge: 2021-12-11 | Disposition: A | Discharge status: Home / Self Care | Payer: Medicare PPO | Source: Ambulatory Visit | Attending: Ophthalmology | Admitting: Ophthalmology

## 2021-12-11 ENCOUNTER — Encounter
Admission: RE | Disposition: A | Discharge status: Home / Self Care | Payer: Self-pay | Source: Ambulatory Visit | Attending: Ophthalmology

## 2021-12-11 DIAGNOSIS — K219 Gastro-esophageal reflux disease without esophagitis: Secondary | ICD-10-CM | POA: Diagnosis not present

## 2021-12-11 DIAGNOSIS — H2511 Age-related nuclear cataract, right eye: Secondary | ICD-10-CM | POA: Diagnosis present

## 2021-12-11 DIAGNOSIS — G510 Bell's palsy: Secondary | ICD-10-CM | POA: Insufficient documentation

## 2021-12-11 DIAGNOSIS — I739 Peripheral vascular disease, unspecified: Secondary | ICD-10-CM | POA: Insufficient documentation

## 2021-12-11 DIAGNOSIS — Z86718 Personal history of other venous thrombosis and embolism: Secondary | ICD-10-CM | POA: Diagnosis not present

## 2021-12-11 DIAGNOSIS — Z87891 Personal history of nicotine dependence: Secondary | ICD-10-CM | POA: Insufficient documentation

## 2021-12-11 DIAGNOSIS — I10 Essential (primary) hypertension: Secondary | ICD-10-CM | POA: Diagnosis not present

## 2021-12-11 HISTORY — PX: CATARACT EXTRACTION W/PHACO: SHX586

## 2021-12-11 SURGERY — PHACOEMULSIFICATION, CATARACT, WITH IOL INSERTION
Anesthesia: Monitor Anesthesia Care | Site: Eye | Laterality: Right

## 2021-12-11 MED ORDER — ARMC OPHTHALMIC DILATING DROPS
1.0000 "application " | OPHTHALMIC | Status: DC | PRN
  Administered 2021-12-11 (×3): 1 via OPHTHALMIC

## 2021-12-11 MED ORDER — SIGHTPATH DOSE#1 BSS IO SOLN
INTRAOCULAR | Status: DC | PRN
  Administered 2021-12-11: 2 mL

## 2021-12-11 MED ORDER — MIDAZOLAM HCL 2 MG/2ML IJ SOLN
INTRAMUSCULAR | Status: DC | PRN
  Administered 2021-12-11: 1 mg via INTRAVENOUS

## 2021-12-11 MED ORDER — BRIMONIDINE TARTRATE-TIMOLOL 0.2-0.5 % OP SOLN
OPHTHALMIC | Status: DC | PRN
  Administered 2021-12-11: 1 [drp] via OPHTHALMIC

## 2021-12-11 MED ORDER — ACETAMINOPHEN 325 MG PO TABS: 325.0000 mg | ORAL_TABLET | ORAL | Status: DC | PRN

## 2021-12-11 MED ORDER — SIGHTPATH DOSE#1 BSS IO SOLN
INTRAOCULAR | Status: DC | PRN
  Administered 2021-12-11: 58 mL via OPHTHALMIC

## 2021-12-11 MED ORDER — SIGHTPATH DOSE#1 BSS IO SOLN
INTRAOCULAR | Status: DC | PRN
  Administered 2021-12-11: 15 mL via INTRAOCULAR

## 2021-12-11 MED ORDER — SIGHTPATH DOSE#1 NA CHONDROIT SULF-NA HYALURON 40-17 MG/ML IO SOLN
INTRAOCULAR | Status: DC | PRN
  Administered 2021-12-11: 1 mL via INTRAOCULAR

## 2021-12-11 MED ORDER — ACETAMINOPHEN 160 MG/5ML PO SOLN: 325.0000 mg | ORAL | Status: DC | PRN

## 2021-12-11 MED ORDER — FENTANYL CITRATE (PF) 100 MCG/2ML IJ SOLN
INTRAMUSCULAR | Status: DC | PRN
  Administered 2021-12-11: 50 ug via INTRAVENOUS

## 2021-12-11 MED ORDER — ONDANSETRON HCL 4 MG/2ML IJ SOLN: 4.0000 mg | Freq: Once | INTRAMUSCULAR | Status: DC | PRN

## 2021-12-11 MED ORDER — TETRACAINE HCL 0.5 % OP SOLN
1.0000 [drp] | OPHTHALMIC | Status: DC | PRN
  Administered 2021-12-11 (×3): 1 [drp] via OPHTHALMIC

## 2021-12-11 MED ORDER — MOXIFLOXACIN HCL 0.5 % OP SOLN
OPHTHALMIC | Status: DC | PRN
  Administered 2021-12-11: 0.2 mL via OPHTHALMIC

## 2021-12-11 SURGICAL SUPPLY — 14 items
CANNULA ANT/CHMB 27G (MISCELLANEOUS) IMPLANT
CANNULA ANT/CHMB 27GA (MISCELLANEOUS) IMPLANT
CATARACT SUITE SIGHTPATH (MISCELLANEOUS) ×2 IMPLANT
FEE CATARACT SUITE SIGHTPATH (MISCELLANEOUS) ×1 IMPLANT
GLOVE SURG ENC TEXT LTX SZ8 (GLOVE) ×2 IMPLANT
GLOVE SURG TRIUMPH 8.0 PF LTX (GLOVE) ×2 IMPLANT
LENS IOL EYHANCE TORIC II 19.0 ×2 IMPLANT
LENS IOL EYHANCE TRC 225 19.0 IMPLANT
LENS IOL EYHNC TORIC 225 19.0 ×1 IMPLANT
NDL FILTER BLUNT 18X1 1/2 (NEEDLE) ×1 IMPLANT
NEEDLE FILTER BLUNT 18X 1/2SAF (NEEDLE) ×1
NEEDLE FILTER BLUNT 18X1 1/2 (NEEDLE) ×1 IMPLANT
SYR 3ML LL SCALE MARK (SYRINGE) ×2 IMPLANT
WATER STERILE IRR 250ML POUR (IV SOLUTION) ×2 IMPLANT

## 2021-12-11 NOTE — Anesthesia Procedure Notes (Signed)
Procedure Name: Pierz ?Date/Time: 12/11/2021 10:42 AM ?Performed by: Mayme Genta, CRNA ?Pre-anesthesia Checklist: Patient identified, Emergency Drugs available, Suction available, Timeout performed and Patient being monitored ?Patient Re-evaluated:Patient Re-evaluated prior to induction ?Oxygen Delivery Method: Nasal cannula ?Placement Confirmation: positive ETCO2 ? ? ? ? ?

## 2021-12-11 NOTE — Anesthesia Postprocedure Evaluation (Signed)
Anesthesia Post Note ? ?Patient: Dwayne Smith ? ?Procedure(s) Performed: CATARACT EXTRACTION PHACO AND INTRAOCULAR LENS PLACEMENT (IOC) RIGHT TORIC LENS 6.44 00:49.8 (Right: Eye) ? ? ?  ?Patient location during evaluation: PACU ?Anesthesia Type: MAC ?Level of consciousness: awake ?Pain management: pain level controlled ?Vital Signs Assessment: post-procedure vital signs reviewed and stable ?Respiratory status: respiratory function stable ?Cardiovascular status: stable ?Postop Assessment: no apparent nausea or vomiting ?Anesthetic complications: no ? ? ?No notable events documented. ? ?Veda Canning ? ? ? ? ? ?

## 2021-12-11 NOTE — Op Note (Signed)
PREOPERATIVE DIAGNOSIS:  Nuclear sclerotic cataract of the right eye. ?  ?POSTOPERATIVE DIAGNOSIS:  Nuclear sclerotic cataract of the right eye. ?  ?OPERATIVE PROCEDURE: Procedure(s): ?CATARACT EXTRACTION PHACO AND INTRAOCULAR LENS PLACEMENT (IOC) RIGHT TORIC LENS 6.44 00:49.8 ?  ?SURGEON:  Birder Robson, MD. ?  ?ANESTHESIA: ?1.      Managed anesthesia care. ?2.     0.76m of Shugarcaine was instilled following the paracentesis ? Anesthesiologist: HVeda Canning MD ?CRNA: AMayme Genta CRNA ? ?COMPLICATIONS:  None. ?  ?TECHNIQUE:   Stop and chop  ?  ?DESCRIPTION OF PROCEDURE:  The patient was examined and consented in the preoperative holding area where the aforementioned topical anesthesia was applied to the right eye.  The patient was brought back to the Operating Room where he was sat upright on the gurney and given a target to fixate upon while the eye was marked at the 3:00 and 9:00 position.  The patient was then reclined on the operating table.  The eye was prepped and draped in the usual sterile ophthalmic fashion and a lid speculum was placed. A paracentesis was created with the side port blade and the anterior chamber was filled with viscoelastic. A near clear corneal incision was performed with the steel keratome. A continuous curvilinear capsulorrhexis was performed with a cystotome followed by the capsulorrhexis forceps. Hydrodissection and hydrodelineation were carried out with BSS on a blunt cannula. The lens was removed in a stop and chop technique and the remaining cortical material was removed with the irrigation-aspiration handpiece. The eye was inflated with viscoelastic and the ZCT  lens  was placed in the eye and rotated to within a few degrees of the predetermined orientation.  The remaining viscoelastic was removed from the eye.  The Sinskey hook was used to rotate the toric lens into its final resting place at 018 degrees.  0. The eye was inflated to a physiologic pressure and found to  be watertight. 0.117mof Vigamox was placed in the anterior chamber.  The eye was dressed with Vigamox and Combigan The patient was given protective glasses to wear throughout the day and a shield with which to sleep tonight. The patient was also given drops with which to begin a drop regimen today and will follow-up with me in one day. ?Implant Name Type Inv. Item Serial No. Manufacturer Lot No. LRB No. Used Action  ?LENS IOL EYPotomac Valley HospitalI 19.0 - [id number] IOL EYTyler DeisI 19.0 [id number]  Right 1 Implanted  ? ?Procedure(s): ?CATARACT EXTRACTION PHACO AND INTRAOCULAR LENS PLACEMENT (IOC) RIGHT TORIC LENS 6.44 00:49.8 (Right) ? ?Electronically signed: WiBirder Robson/21/2023 10:58 AM ? ? ?

## 2021-12-11 NOTE — H&P (Signed)
[place]  ? ?Primary Care Physician:  Idelle Crouch, MD ?Ophthalmologist: Dr. George Ina ? ?Pre-Procedure History & Physical: ?HPI:  BOBIE KISTLER is a 76 y.o. male here for cataract surgery. ?  ?Past Medical History:  ?Diagnosis Date  ? Arthritis   ? Bell's palsy   ? BPH (benign prostatic hyperplasia)   ? Cataract cortical, senile   ? DVT of lower extremity (deep venous thrombosis) ([place])   ? Dysplastic nevus 02/04/2019  ? left low back 6.0 cm lat to spine, moderate atypia   ? GERD (gastroesophageal reflux disease)   ? Hyperglycemia   ? PONV (postoperative nausea and vomiting)   ? shoulder surgery at [facility]  ? Precancerous skin lesion   ? PVD (peripheral vascular disease) ([place])   ? ? ?Past Surgical History:  ?Procedure Laterality Date  ? CATARACT EXTRACTION W/PHACO Left 11/27/2021  ? Procedure: CATARACT EXTRACTION PHACO AND INTRAOCULAR LENS PLACEMENT ([place]) LEFT TORIC LENS 28.11 02:15.9;  Surgeon: Birder Robson, MD;  Location: [place];  Service: Ophthalmology;  Laterality: Left;  ? colonoscopy with polypectomy    ? COLONOSCOPY WITH PROPOFOL N/A 09/04/2018  ? Procedure: COLONOSCOPY WITH PROPOFOL;  Surgeon: Lollie Sails, MD;  Location: [facility] ENDOSCOPY;  Service: Endoscopy;  Laterality: N/A;  ? ESOPHAGOGASTRODUODENOSCOPY (EGD) WITH PROPOFOL N/A 09/04/2018  ? Procedure: ESOPHAGOGASTRODUODENOSCOPY (EGD) WITH PROPOFOL;  Surgeon: Lollie Sails, MD;  Location: [facility] ENDOSCOPY;  Service: Endoscopy;  Laterality: N/A;  ? HERNIA REPAIR  2355  ? umbilical  ? SHOULDER ARTHROSCOPY WITH ROTATOR CUFF [facility]  ? SHOULDER SURGERY Right 1990  ? bone spur  ? Vitreous hemorrhage    ? ? ?Prior to Admission medications   ?Medication Sig Start Date End Date Taking? Authorizing Provider  ?acetaminophen (TYLENOL) 325 MG tablet Take 2 tablets (650 mg total) by mouth every 4 (four) hours as needed for mild pain ((score 1 to 3) or temp > 100.5). 01/27/21  Yes Meade Maw, MD   ?amLODipine-benazepril (LOTREL) 5-10 MG capsule Take 1 capsule by mouth daily.   Yes [provider]  ?aspirin 325 MG tablet Take 325 mg by mouth daily.   Yes [provider]  ?finasteride (PROSCAR) 5 MG tablet Take 5 mg by mouth daily.   Yes [provider]  ?gabapentin (NEURONTIN) 400 MG capsule  05/07/21  Yes [provider]  ?methocarbamol (ROBAXIN) 500 MG tablet Take 1 tablet (500 mg total) by mouth every 6 (six) hours as needed for muscle spasms. 01/27/21  Yes Meade Maw, MD  ?nabumetone (RELAFEN) 500 MG tablet Take 500 mg by mouth 2 (two) times daily.   Yes [provider]  ?simvastatin (ZOCOR) 20 MG tablet Take 20 mg by mouth at bedtime.   Yes [provider]  ?tadalafil (CIALIS) 5 MG tablet Take 5 mg by mouth daily as needed for erectile dysfunction.   Yes [provider]  ?terazosin (HYTRIN) 5 MG capsule Take 5 mg by mouth at bedtime.   Yes [provider]  ?hydrocortisone 2.5 % cream Apply to aa's scalp QHS on Tuesday, Thursday, and Saturday. 06/06/21   Ralene Bathe, MD  ?ketoconazole (NIZORAL) 2 % cream Apply to aa's scalp QHS on Monday, Wednesday, and Friday. 06/06/21   Ralene Bathe, MD  ?oxyCODONE (OXY IR/ROXICODONE) 5 MG immediate release tablet Take 1 tablet (5 mg total) by mouth every 3 (three) hours as needed for moderate pain ((score 4 to 6)). ?Patient not taking: Reported on  12/11/2021 01/27/21   Meade Maw, MD  ? ? ?Allergies as of 10/30/2021  ? (No Known Allergies)  ? ? ?History reviewed. No pertinent family history. ? ?Social History  ? ?Socioeconomic History  ? Marital status: Married  ?  Spouse name: Not on file  ? Number of children: Not on file  ? Years of education: Not on file  ? Highest education level: Not on file  ?Occupational History  ? Not on file  ?Tobacco Use  ? Smoking status: Former  ?  Types: Cigarettes  ?  Quit date: 16  ?  Years since quitting: 64.2  ? Smokeless tobacco: Never   ?Vaping Use  ? Vaping Use: Never used  ?Substance and Sexual Activity  ? Alcohol use: Not Currently  ?  Alcohol/week: 7.0 standard drinks  ?  Types: 7 Cans of beer per week  ? Drug use: No  ? Sexual activity: Not on file  ?Other Topics Concern  ? Not on file  ?Social History Narrative  ? Not on file  ? ?Social Determinants of Health  ? ?Financial Resource Strain: Not on file  ?Food Insecurity: Not on file  ?Transportation Needs: Not on file  ?Physical Activity: Not on file  ?Stress: Not on file  ?Social Connections: Not on file  ?Intimate Partner Violence: Not on file  ? ? ?Review of Systems: ?See HPI, otherwise negative ROS ? ?Physical Exam: ?BP 138/77   Pulse 69   Temp (!) 97.2 ?F (36.2 ?C)   Ht '5\' 7"'$  (1.702 m)   Wt 76.7 kg   SpO2 100%   BMI 26.47 kg/m?  ?General:   Alert, cooperative in NAD ?Head:  Normocephalic and atraumatic. ?Respiratory:  Normal work of breathing. ?Cardiovascular:  RRR ? ?Impression/Plan: ?MATHEAU ORONA is here for cataract surgery. ? ?Risks, benefits, limitations, and alternatives regarding cataract surgery have been reviewed with the patient.  Questions have been answered.  All parties agreeable. ? ? ?Birder Robson, MD  12/11/2021, 10:29 AM ? ? ?

## 2021-12-11 NOTE — Transfer of Care (Signed)
Immediate Anesthesia Transfer of Care Note ? ?Patient: Dwayne Smith ? ?Procedure(s) Performed: CATARACT EXTRACTION PHACO AND INTRAOCULAR LENS PLACEMENT (IOC) RIGHT TORIC LENS 6.44 00:49.8 (Right: Eye) ? ?Patient Location: PACU ? ?Anesthesia Type: MAC ? ?Level of Consciousness: awake, alert  and patient cooperative ? ?Airway and Oxygen Therapy: Patient Spontanous Breathing and Patient connected to supplemental oxygen ? ?Post-op Assessment: Post-op Vital signs reviewed, Patient's Cardiovascular Status Stable, Respiratory Function Stable, Patent Airway and No signs of Nausea or vomiting ? ?Post-op Vital Signs: Reviewed and stable ? ?Complications: No notable events documented. ? ?

## 2021-12-11 NOTE — Anesthesia Preprocedure Evaluation (Signed)
Anesthesia Evaluation  ?Patient identified by MRN, date of birth, ID band ?Patient awake ? ? ? ?Reviewed: ?Allergy & Precautions, H&P , NPO status  ? ?History of Anesthesia Complications ?(+) PONV and history of anesthetic complications ? ?Airway ?Mallampati: II ? ?TM Distance: >3 FB ?Neck ROM: full ? ? ? Dental ?no notable dental hx. ? ?  ?Pulmonary ?former smoker,  ?  ?breath sounds clear to auscultation ? ? ? ? ? ? Cardiovascular ?Exercise Tolerance: Good ?hypertension, + Peripheral Vascular Disease and + DVT  ? ?Rhythm:regular Rate:Normal ? ? ?  ?Neuro/Psych ?Bell's palsy history ?negative psych ROS  ? GI/Hepatic ?GERD  ,  ?Endo/Other  ? ? Renal/GU ?  ? ?  ?Musculoskeletal ? ?(+) Arthritis ,  ? Abdominal ?  ?Peds ? Hematology ?  ?Anesthesia Other Findings ? ? Reproductive/Obstetrics ? ?  ? ? ? ? ? ? ? ? ? ? ? ? ? ?  ?  ? ? ? ? ? ? ? ? ?Anesthesia Physical ? ?Anesthesia Plan ? ?ASA: 2 ? ?Anesthesia Plan: MAC  ? ?Post-op Pain Management:   ? ?Induction:  ? ?PONV Risk Score and Plan: 2 and TIVA, Midazolam and Treatment may vary due to age or medical condition ? ?Airway Management Planned:  ? ?Additional Equipment:  ? ?Intra-op Plan:  ? ?Post-operative Plan:  ? ?Informed Consent: I have reviewed the patients History and Physical, chart, labs and discussed the procedure including the risks, benefits and alternatives for the proposed anesthesia with the patient or authorized representative who has indicated his/her understanding and acceptance.  ? ? ? ?Dental Advisory Given ? ?Plan Discussed with: CRNA ? ?Anesthesia Plan Comments:   ? ? ? ? ? ? ?Anesthesia Quick Evaluation ? ?

## 2021-12-12 ENCOUNTER — Encounter: Payer: Self-pay | Admitting: Ophthalmology

## 2022-01-16 ENCOUNTER — Encounter: Payer: Self-pay | Admitting: Dermatology

## 2022-01-16 ENCOUNTER — Ambulatory Visit (INDEPENDENT_AMBULATORY_CARE_PROVIDER_SITE_OTHER): Payer: Medicare PPO | Admitting: Dermatology

## 2022-01-16 DIAGNOSIS — Z1283 Encounter for screening for malignant neoplasm of skin: Secondary | ICD-10-CM

## 2022-01-16 DIAGNOSIS — L219 Seborrheic dermatitis, unspecified: Secondary | ICD-10-CM

## 2022-01-16 DIAGNOSIS — L820 Inflamed seborrheic keratosis: Secondary | ICD-10-CM | POA: Diagnosis not present

## 2022-01-16 DIAGNOSIS — L570 Actinic keratosis: Secondary | ICD-10-CM | POA: Diagnosis not present

## 2022-01-16 DIAGNOSIS — L578 Other skin changes due to chronic exposure to nonionizing radiation: Secondary | ICD-10-CM | POA: Diagnosis not present

## 2022-01-16 DIAGNOSIS — L814 Other melanin hyperpigmentation: Secondary | ICD-10-CM

## 2022-01-16 DIAGNOSIS — Z86018 Personal history of other benign neoplasm: Secondary | ICD-10-CM

## 2022-01-16 DIAGNOSIS — L821 Other seborrheic keratosis: Secondary | ICD-10-CM

## 2022-01-16 DIAGNOSIS — D1800 Hemangioma unspecified site: Secondary | ICD-10-CM

## 2022-01-16 DIAGNOSIS — D229 Melanocytic nevi, unspecified: Secondary | ICD-10-CM

## 2022-01-16 NOTE — Progress Notes (Signed)
? ?Follow-Up Visit ?  ?Subjective  ?Dwayne Smith is a 76 y.o. male who presents for the following: Actinic Keratosis (6 month recheck. Hx of LN2 and PDT treatment in the past. Scaly, pink. UBSE today) and Rash (Scalp. Seborrheic dermatitis. Using HC 2.5% cream and Ketoconazole 2% cream as directed. Mild, controlled). ?The patient presents for Upper Body Skin Exam (UBSE) for skin cancer screening and mole check.  The patient has spots, moles and lesions to be evaluated, some may be new or changing and the patient has concerns that these could be cancer. ? ?The following portions of the chart were reviewed this encounter and updated as appropriate:  Tobacco  Allergies  Meds  Problems  Med Hx  Surg Hx  Fam Hx   ?  ?Review of Systems: No other skin or systemic complaints except as noted in HPI or Assessment and Plan. ? ?Objective  ?Well appearing patient in no apparent distress; mood and affect are within normal limits. ? ?All skin waist up examined. ? ?Scalp x18, right sideburn x1 (19) ?Erythematous thin papules/macules with gritty scale.  ? ?Scalp x2, right ant shoulder x1 (3) ?Erythematous keratotic or waxy stuck-on papule or plaque. ? ?Scalp ?Pink patches with greasy scale.  ? ? ?Assessment & Plan  ?Lentigines ?- Scattered tan macules ?- Due to sun exposure ?- Benign-appearing, observe ?- Recommend daily broad spectrum sunscreen SPF 30+ to sun-exposed areas, reapply every 2 hours as needed. ?- Call for any changes ? ?Seborrheic Keratoses ?- Stuck-on, waxy, tan-brown papules and/or plaques  ?- Benign-appearing ?- Discussed benign etiology and prognosis. ?- Observe ?- Call for any changes ? ?Melanocytic Nevi ?- Tan-brown and/or pink-flesh-colored symmetric macules and papules ?- Benign appearing on exam today ?- Observation ?- Call clinic for new or changing moles ?- Recommend daily use of broad spectrum spf 30+ sunscreen to sun-exposed areas.  ? ?Hemangiomas ?- Red papules ?- Discussed benign nature ?-  Observe ?- Call for any changes ? ?Actinic Damage ?- Chronic condition, secondary to cumulative UV/sun exposure ?- diffuse scaly erythematous macules with underlying dyspigmentation ?- Recommend daily broad spectrum sunscreen SPF 30+ to sun-exposed areas, reapply every 2 hours as needed.  ?- Staying in the shade or wearing long sleeves, sun glasses (UVA+UVB protection) and wide brim hats (4-inch brim around the entire circumference of the hat) are also recommended for sun protection.  ?- Call for new or changing lesions. ? ?Skin cancer screening performed today. ? ?History of Dysplastic Nevi ?- No evidence of recurrence today ?- Recommend regular full body skin exams ?- Recommend daily broad spectrum sunscreen SPF 30+ to sun-exposed areas, reapply every 2 hours as needed.  ?- Call if any new or changing lesions are noted between office visits  ? 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 They are chronic with expected duration over 1 year. A portion of actinic keratoses will progress to squamous cell carcinoma of the skin. It is not possible to reliably predict which spots will progress to skin cancer and so treatment is recommended to prevent development of skin cancer. ?Recommend daily broad spectrum sunscreen SPF 30+ to sun-exposed areas, reapply every 2 hours as needed.  ?Recommend staying in the shade or wearing long sleeves, sun glasses (UVA+UVB protection) and wide brim hats (4-inch brim around the entire circumference of the hat). ?Call for new or changing lesions. ?Destruction of lesion - Scalp x18,  right sideburn x1 ?Complexity: simple   ?Destruction method: cryotherapy   ?Informed consent: discussed and consent obtained   ?Timeout:  patient name, date of birth, surgical site, and procedure verified ?Lesion destroyed using liquid nitrogen: Yes   ?Region frozen until  ice ball extended beyond lesion: Yes   ?Outcome: patient tolerated procedure well with no complications   ?Post-procedure details: wound care instructions given   ? ?Inflamed seborrheic keratosis (3) ?Scalp x2, right ant shoulder x1 ?Destruction of lesion - Scalp x2, right ant shoulder x1 ?Complexity: simple   ?Destruction method: cryotherapy   ?Informed consent: discussed and consent obtained   ?Timeout:  patient name, date of birth, surgical site, and procedure verified ?Lesion destroyed using liquid nitrogen: Yes   ?Region frozen until ice ball extended beyond lesion: Yes   ?Outcome: patient tolerated procedure well with no complications   ?Post-procedure details: wound care instructions given   ? ?Seborrheic dermatitis ?Scalp ?Chronic and persistent condition with duration or expected duration over one year. Condition is symptomatic / bothersome to patient. Not to goal. ?Continue Ketoconazole 2% cream QHS on M, W, F and HC 2.5% cream QHS on T, Th, and Sat.  ?Seborrheic Dermatitis  ?-  is a chronic persistent rash characterized by pinkness and scaling most commonly of the mid face but also can occur on the scalp (dandruff), ears; mid chest, mid back and groin.  It tends to be exacerbated by stress and cooler weather.  People who have neurologic disease may experience new onset or exacerbation of existing seborrheic dermatitis.  The condition is not curable but treatable and can be controlled. ? ?Related Medications ?hydrocortisone 2.5 % cream ?Apply to aa's scalp QHS on Tuesday, Thursday, and Saturday. ?ketoconazole (NIZORAL) 2 % cream ?Apply to aa's scalp QHS on Monday, Wednesday, and Friday. ? ?Return in about 6 months (around 07/18/2022) for AK Follow Up. ? ?I, Emelia Salisbury, CMA, am acting as scribe for Sarina Ser, MD. ?Documentation: I have reviewed the above documentation for accuracy and completeness, and I agree with the above. ? ?Sarina Ser, MD ? ? ?

## 2022-01-16 NOTE — Patient Instructions (Addendum)
Scalp: ?Continue Ketoconazole 2% cream QHS on M, W, F and HC 2.5% cream QHS on T, Th, and Sat.  ? ? ?Cryotherapy Aftercare ? ?Wash gently with soap and water everyday.   ?Apply Vaseline and Band-Aid daily until healed.  ? ?Prior to procedure, discussed risks of blister formation, small wound, skin dyspigmentation, or rare scar following cryotherapy. Recommend Vaseline ointment to treated areas while healing.  ? ? ?If You Need Anything After Your Visit ? ?If you have any questions or concerns for your doctor, please call our main line at [phone] and press option 4 to reach your doctor's medical assistant. If no one answers, please leave a voicemail as directed and we will return your call as soon as possible. Messages left after 4 pm will be answered the following business day.  ? ?You may also send [place] a message via MyChart. We typically respond to MyChart messages within 1-2 business days. ? ?For prescription refills, please ask your pharmacy to contact our office. Our fax number is [phone]. ? ?If you have an urgent issue when the clinic is closed that cannot wait until the next business day, you can page your doctor at the number below.   ? ?Please note that while we do our best to be available for urgent issues outside of office hours, we are not available 24/7.  ? ?If you have an urgent issue and are unable to reach [place], you may choose to seek medical care at your doctor's office, retail clinic, urgent care center, or emergency room. ? ?If you have a medical emergency, please immediately call 911 or go to the emergency department. ? ?Pager Numbers ? ?- Dr. Nehemiah Massed: [phone] ? ?- Dr. Laurence Ferrari: [phone] ? ?- Dr. Nicole Kindred: [phone] ? ?In the event of inclement weather, please call our main line at [phone] for an update on the status of any delays or closures. ? ?Dermatology Medication Tips: ?Please keep the boxes that topical medications come in in order to help keep track of the instructions  about where and how to use these. Pharmacies typically print the medication instructions only on the boxes and not directly on the medication tubes.  ? ?If your medication is too expensive, please contact our office at [phone] option 4 or send [place] a message through [place].  ? ?We are unable to tell what your co-pay for medications will be in advance as this is different depending on your insurance coverage. However, we may be able to find a substitute medication at lower cost or fill out paperwork to get insurance to cover a needed medication.  ? ?If a prior authorization is required to get your medication covered by your insurance company, please allow [place] 1-2 business days to complete this process. ? ?Drug prices often vary depending on where the prescription is filled and some pharmacies may offer cheaper prices. ? ?The website www.goodrx.com contains coupons for medications through different pharmacies. The prices here do not account for what the cost may be with help from insurance (it may be cheaper with your insurance), but the website can give you the price if you did not use any insurance.  ?- You can print the associated coupon and take it with your prescription to the pharmacy.  ?- You may also stop by our office during regular business hours and pick up a GoodRx coupon card.  ?- If you need your prescription sent electronically to a different pharmacy, notify our office through [facility] or by phone  at [phone] option 4. ? ? ? ? ?Si Usted Necesita Algo Despu?s de Su Visita ? ?Tambi?n puede enviarnos un mensaje a trav?s de MyChart. Por lo general respondemos a los mensajes de MyChart en el transcurso de 1 a 2 d?as h?biles. ? ?Para renovar recetas, por favor pida a su farmacia que se ponga en contacto con nuestra oficina. Nuestro n?mero de fax es el [phone]. ? ?Si tiene un asunto urgente cuando la cl?nica est? cerrada y que no puede esperar hasta el siguiente d?a h?bil, puede  llamar/localizar a su doctor(a) al n?mero que aparece a continuaci?n.  ? ?Por favor, tenga en cuenta que aunque hacemos todo lo posible para estar disponibles para asuntos urgentes fuera del horario de oficina, no estamos disponibles las 24 horas del d?a, los 7 d?as de la semana.  ? ?Si tiene un problema urgente y no puede comunicarse con nosotros, puede optar por buscar atenci?n m?dica  en el consultorio de su doctor(a), en una cl?nica privada, en un centro de atenci?n urgente o en una sala de emergencias. ? ?Si tiene Engineer, maintenance (IT) m?dica, por favor llame inmediatamente al 911 o vaya a la sala de emergencias. ? ?N?meros de b?per ? ?- Dr. Nehemiah Massed: [phone] ? ?- Dra. Moye: [phone] ? ?- Dra. Nicole Kindred: [phone] ? ?En caso de inclemencias del tiempo, por favor llame a nuestra l?nea principal al [phone] para una actualizaci?n sobre el estado de cualquier retraso o cierre. ? ?Consejos para la medicaci?n en dermatolog?a: ?Por favor, guarde las cajas en las que vienen los medicamentos de uso t?pico para ayudarle a seguir las instrucciones sobre d?nde y c?mo usarlos. Las farmacias generalmente imprimen las instrucciones del medicamento s?lo en las cajas y no directamente en los tubos del [place].  ? ?Si su medicamento es muy caro, por favor, p?ngase en contacto con Zigmund Daniel llamando al [phone] y presione la opci?n 4 o env?enos un mensaje a trav?s de MyChart.  ? ?No podemos decirle cu?l ser? su copago por los medicamentos por adelantado ya que esto es diferente dependiendo de la cobertura de su seguro. Sin embargo, es posible que podamos encontrar un medicamento sustituto a Electrical engineer un formulario para que el seguro cubra el medicamento que se considera necesario.  ? ?Si se requiere Ardelia Mems autorizaci?n previa para que su compa??a de seguros [place] su medicamento, por favor perm?tanos de 1 a 2 d?as h?biles para completar este proceso. ? ?Los precios de los medicamentos var?an con  frecuencia dependiendo del Environmental consultant de d?nde se surte la receta y alguna farmacias pueden ofrecer precios m?s baratos. ? ?El sitio web www.goodrx.com tiene cupones para medicamentos de Airline pilot. Los precios aqu? no tienen en cuenta lo que podr?a costar con la ayuda del seguro (puede ser m?s barato con su seguro), pero el sitio web puede darle el precio si no utiliz? ning?n seguro.  ?- Puede imprimir el cup?n correspondiente y llevarlo con su receta a la farmacia.  ?- Tambi?n puede pasar por nuestra oficina durante el horario de atenci?n regular y recoger una tarjeta de cupones de GoodRx.  ?- Si necesita que su receta se env?e electr?nicamente a Chiropodist, informe a nuestra oficina a trav?s de MyChart de [place] o por tel?fono llamando al [phone] y presione la opci?n 4.  ?

## 2022-01-23 ENCOUNTER — Encounter: Payer: Self-pay | Admitting: Dermatology

## 2022-04-01 IMAGING — RF DG C-ARM 1-60 MIN
1 series · 4 of 4 positions shown · non-contrast
Comparison: None.

CLINICAL DATA: Surgery.

EXAM:
CERVICAL SPINE - 2-3 VIEW; DG C-ARM 1-60 MIN

[Series 1: dg x-ray · 0.20mm/px · 4 of 4 slices shown]
[im 1/4]
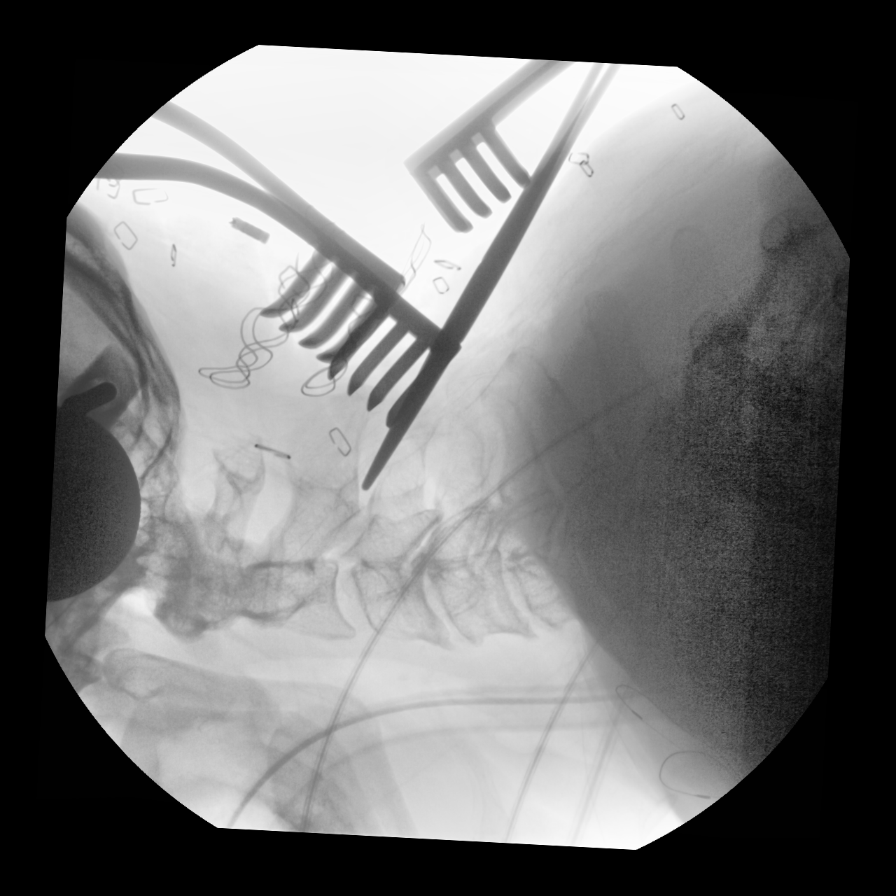
[im 2/4]
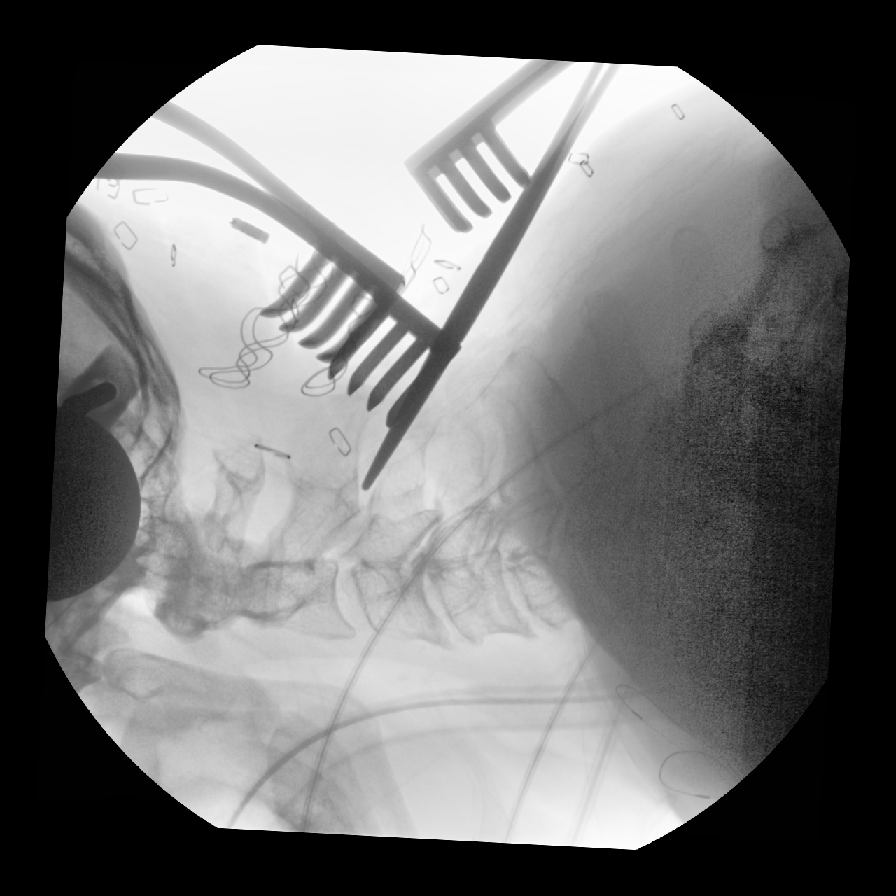
[im 3/4]
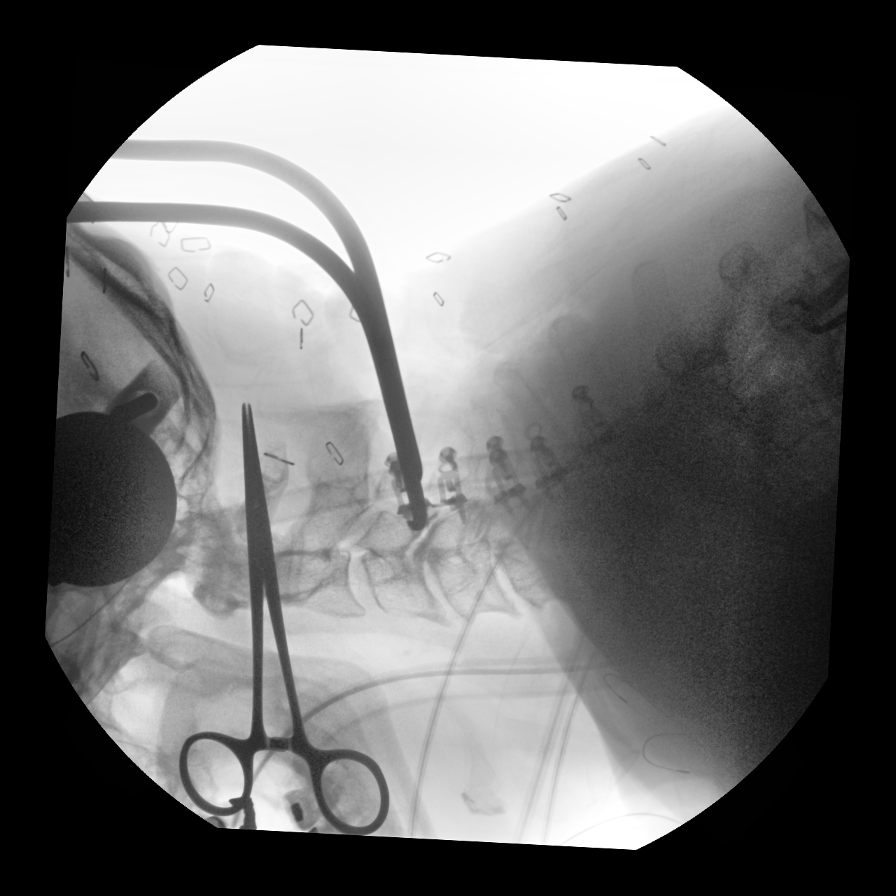
[im 4/4]
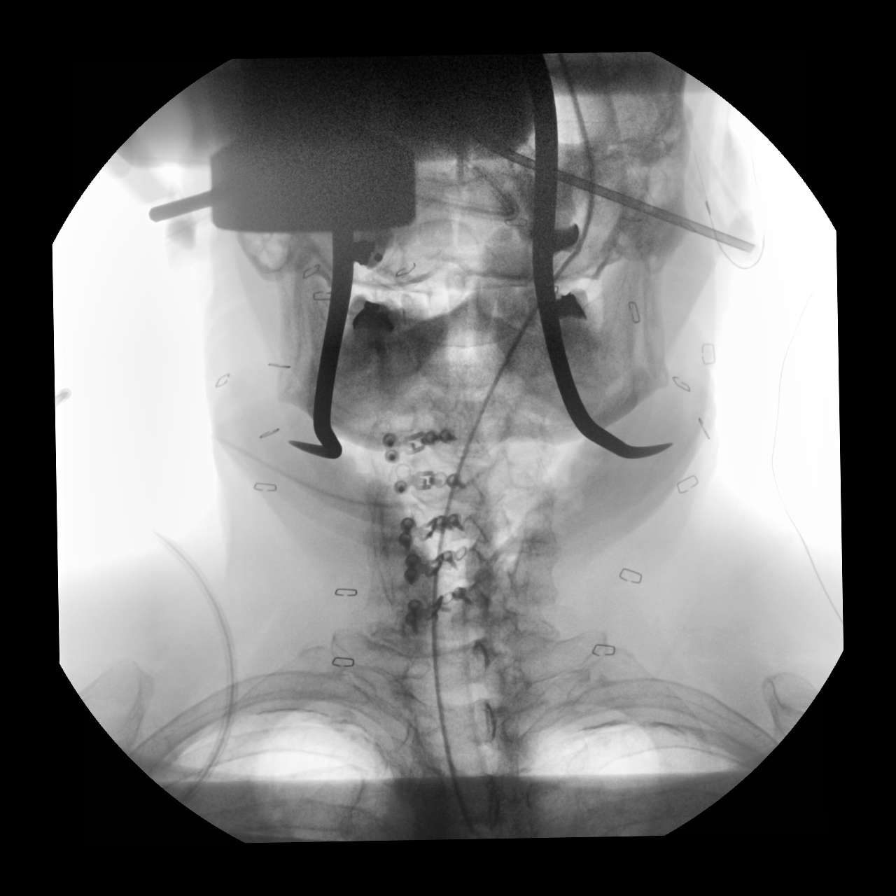

[4 of 4 positions shown; findings below may reference images not displayed]

FINDINGS: Fluoro time: 2 seconds.

Four C-arm fluoroscopic images were obtained intraoperatively and
submitted for post operative interpretation. The first image
demonstrates surgical probe posteriorly at the C2-C3 level. The
second and third images demonstrate C3 through C7 laminoplasties.
Please see the performing provider's procedural report for further
detail.
IMPRESSION: Intraoperative fluoroscopy, as detailed above.

## 2022-04-01 IMAGING — RF DG CERVICAL SPINE 2 OR 3 VIEWS
1 series · 4 of 4 positions shown · non-contrast
Comparison: None.

CLINICAL DATA: Surgery.

EXAM:
CERVICAL SPINE - 2-3 VIEW; DG C-ARM 1-60 MIN

[Series 1: dg x-ray · 0.20mm/px · 4 of 4 slices shown]
[im 1/4]
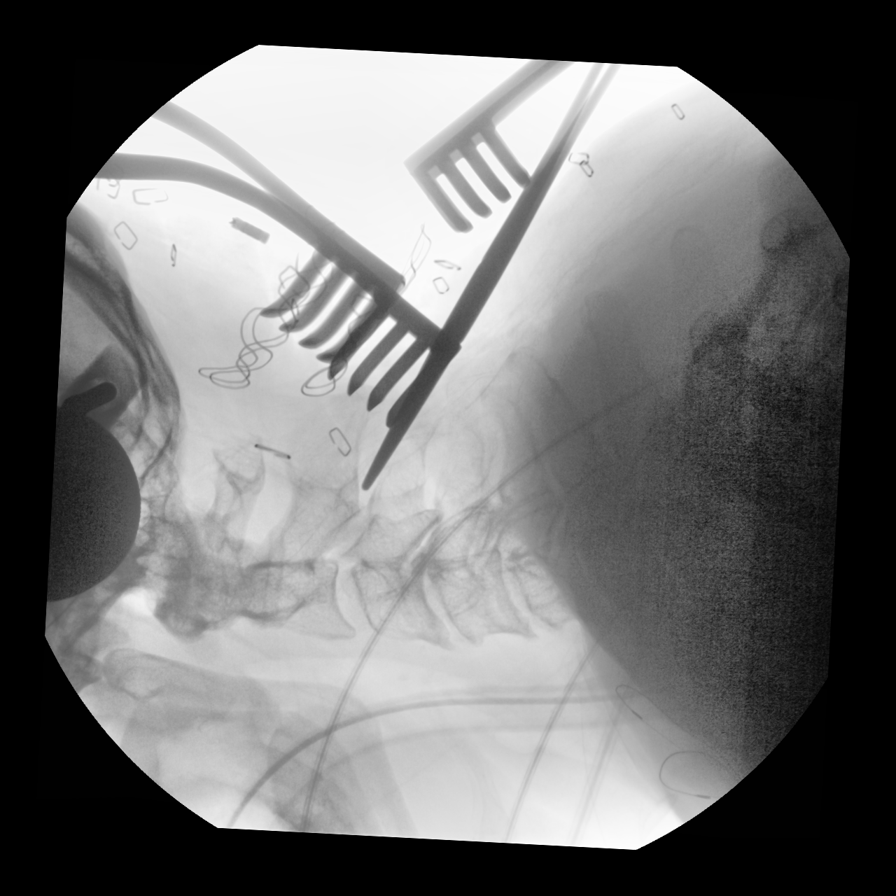
[im 2/4]
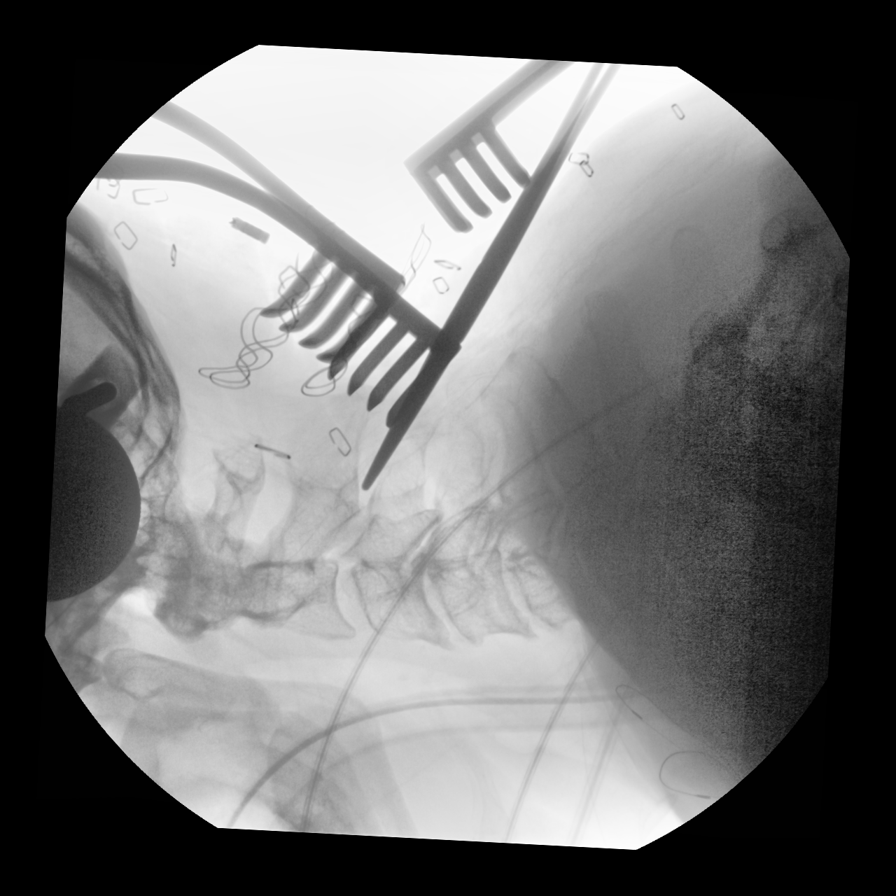
[im 3/4]
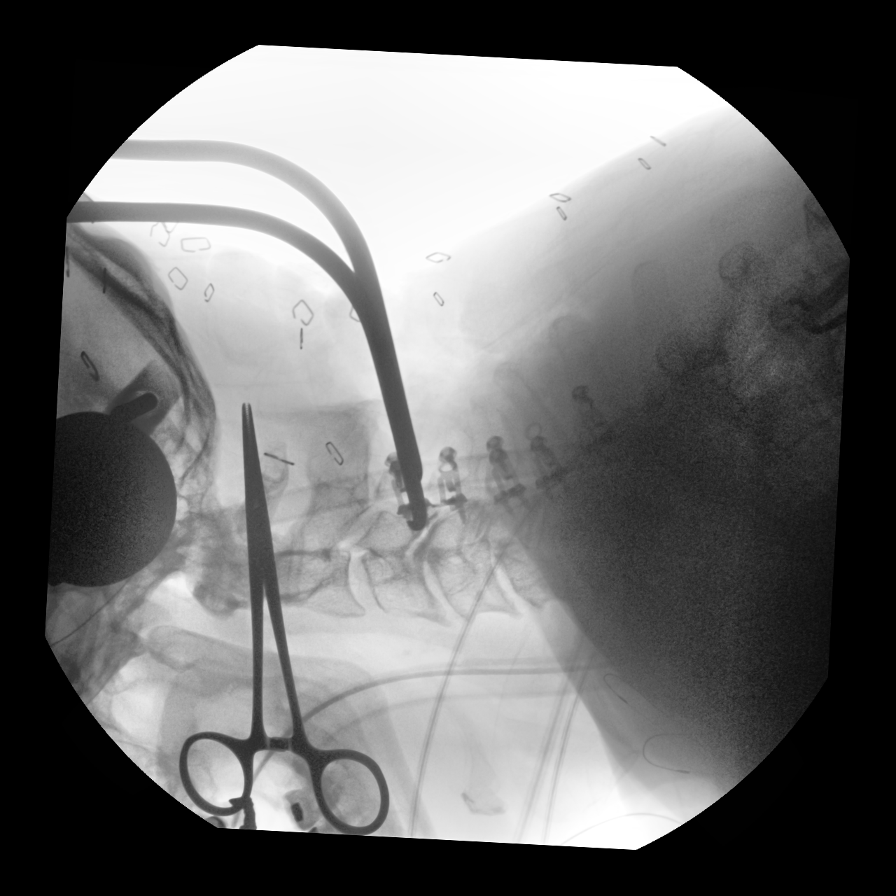
[im 4/4]
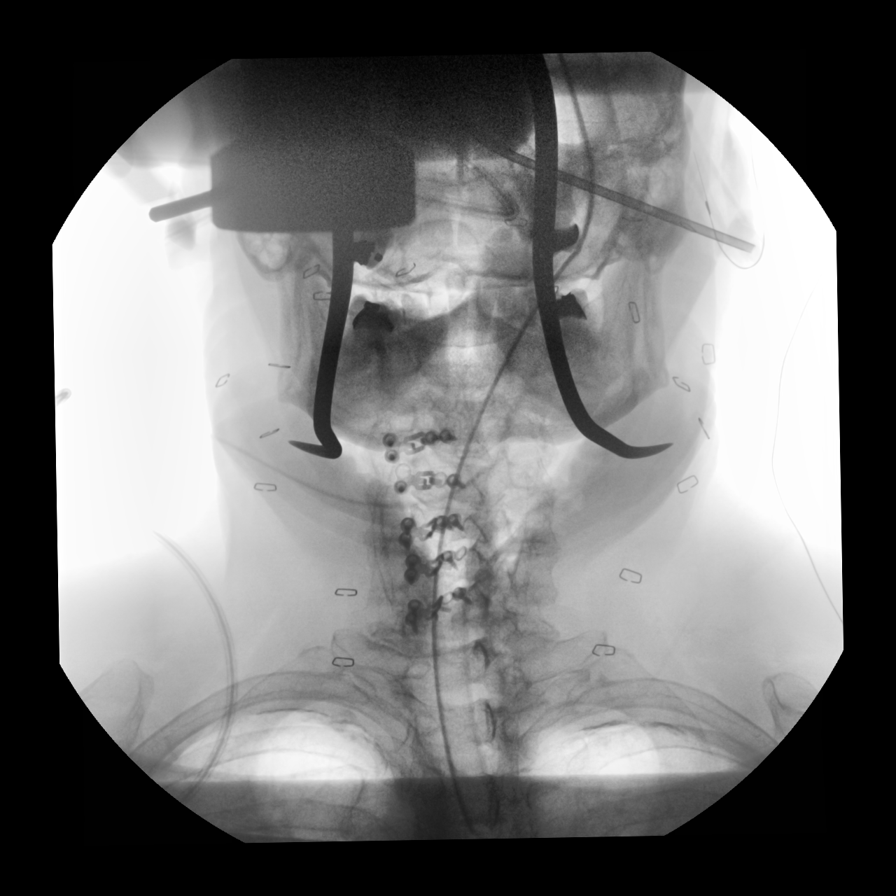

[4 of 4 positions shown; findings below may reference images not displayed]

FINDINGS: Fluoro time: 2 seconds.

Four C-arm fluoroscopic images were obtained intraoperatively and
submitted for post operative interpretation. The first image
demonstrates surgical probe posteriorly at the C2-C3 level. The
second and third images demonstrate C3 through C7 laminoplasties.
Please see the performing provider's procedural report for further
detail.
IMPRESSION: Intraoperative fluoroscopy, as detailed above.

## 2022-04-01 IMAGING — RF DG C-ARM 1-60 MIN
1 series · 4 of 4 positions shown · non-contrast
Comparison: None.

CLINICAL DATA: Surgery.

EXAM:
CERVICAL SPINE - 2-3 VIEW; DG C-ARM 1-60 MIN

[Series 1: dg x-ray · 0.20mm/px · 4 of 4 slices shown]
[im 1/4]
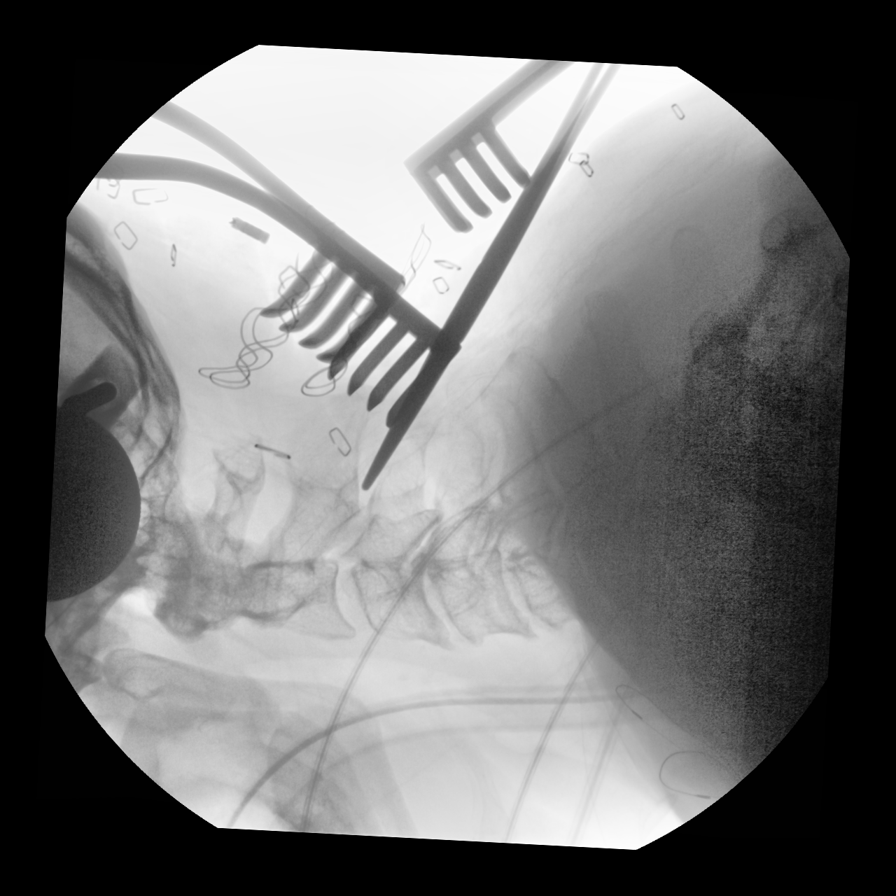
[im 2/4]
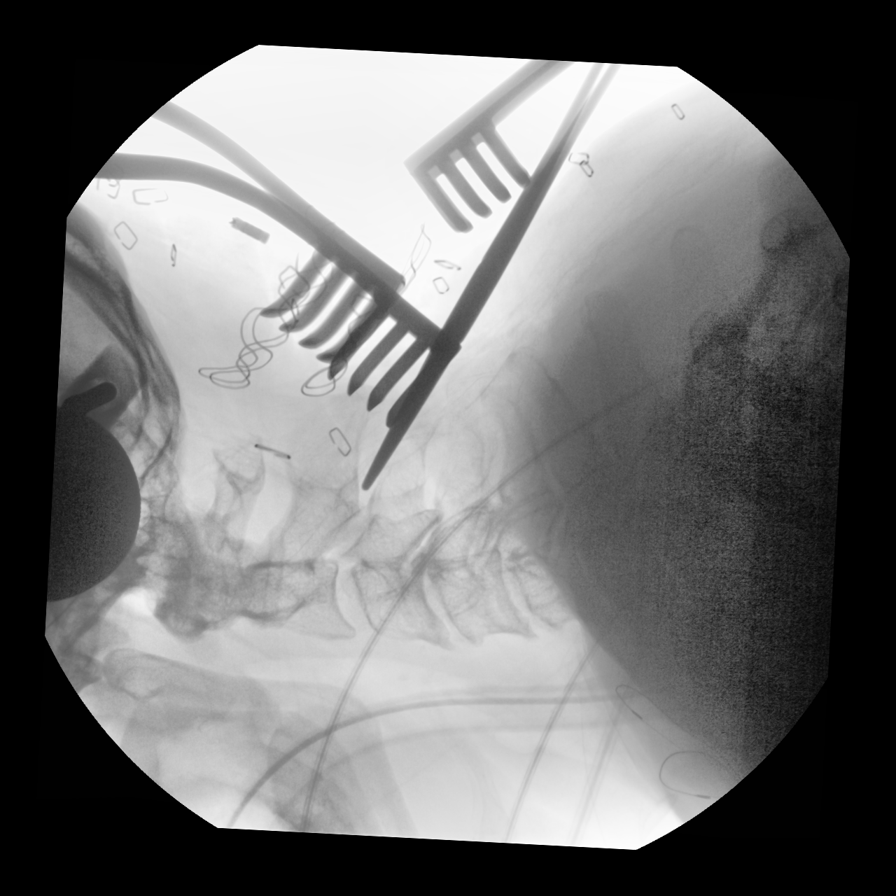
[im 3/4]
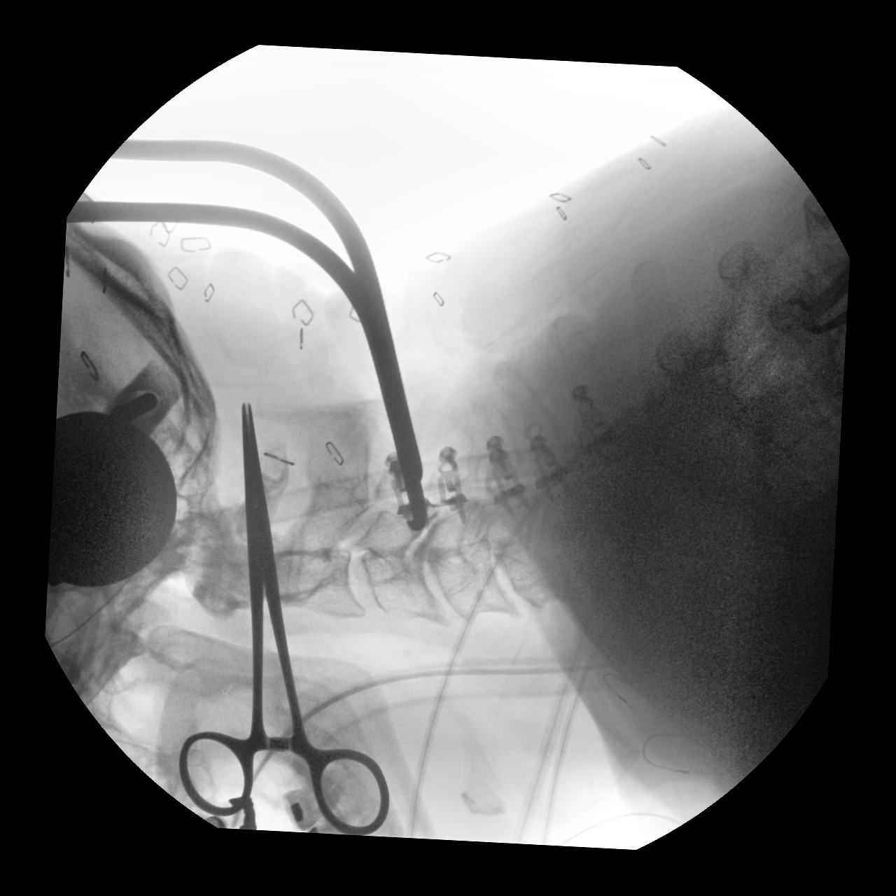
[im 4/4]
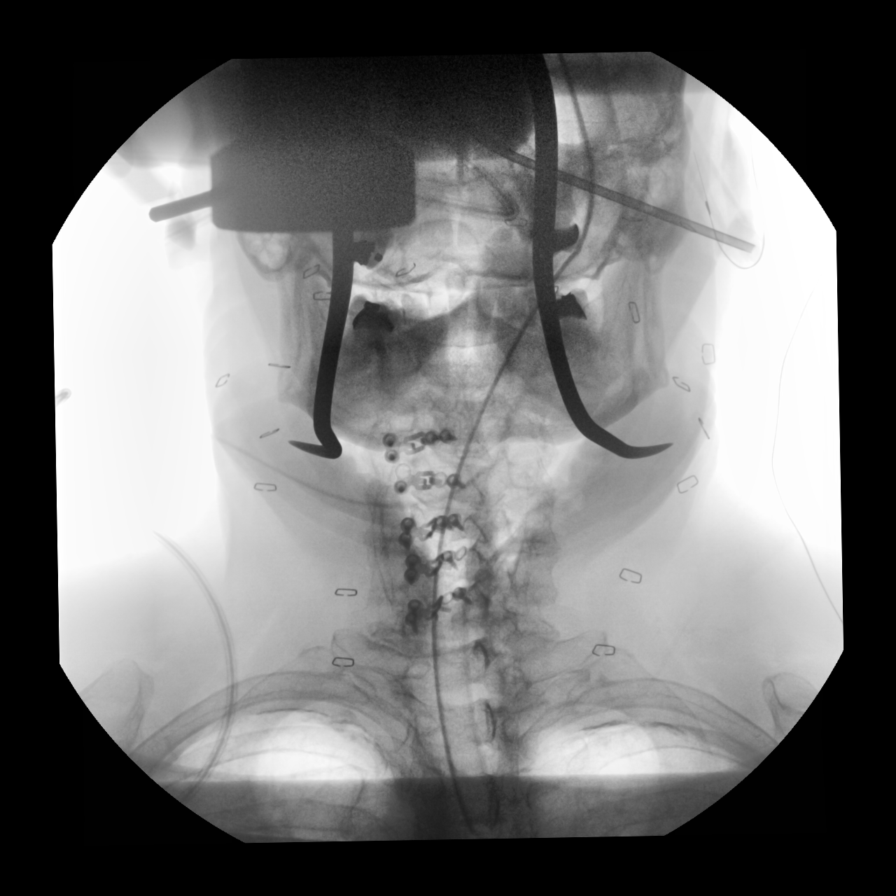

[4 of 4 positions shown; findings below may reference images not displayed]

FINDINGS: Fluoro time: 2 seconds.

Four C-arm fluoroscopic images were obtained intraoperatively and
submitted for post operative interpretation. The first image
demonstrates surgical probe posteriorly at the C2-C3 level. The
second and third images demonstrate C3 through C7 laminoplasties.
Please see the performing provider's procedural report for further
detail.
IMPRESSION: Intraoperative fluoroscopy, as detailed above.

## 2022-07-18 ENCOUNTER — Ambulatory Visit (INDEPENDENT_AMBULATORY_CARE_PROVIDER_SITE_OTHER): Payer: Medicare PPO | Admitting: Dermatology

## 2022-07-18 DIAGNOSIS — L219 Seborrheic dermatitis, unspecified: Secondary | ICD-10-CM

## 2022-07-18 DIAGNOSIS — Z79899 Other long term (current) drug therapy: Secondary | ICD-10-CM | POA: Diagnosis not present

## 2022-07-18 DIAGNOSIS — L570 Actinic keratosis: Secondary | ICD-10-CM | POA: Diagnosis not present

## 2022-07-18 DIAGNOSIS — Z5111 Encounter for antineoplastic chemotherapy: Secondary | ICD-10-CM | POA: Diagnosis not present

## 2022-07-18 DIAGNOSIS — L820 Inflamed seborrheic keratosis: Secondary | ICD-10-CM

## 2022-07-18 DIAGNOSIS — L578 Other skin changes due to chronic exposure to nonionizing radiation: Secondary | ICD-10-CM

## 2022-07-18 DIAGNOSIS — W899XXD Exposure to unspecified man-made visible and ultraviolet light, subsequent encounter: Secondary | ICD-10-CM

## 2022-07-18 NOTE — Patient Instructions (Addendum)
Instructions for [facility] Medications  One or more of your medications was sent to the [facility] mail order compounding pharmacy. You will receive an email from them and can purchase the medicine through that link. It will then be mailed to your home at the address you confirmed. If for any reason you do not receive an email from them, please check your spam folder. If you still do not find the email, please let us know. [facility] phone number is [phone]. Start cream in December - apply to scalp twice daily for 7 days.  Cryotherapy Aftercare  Wash gently with soap and water everyday.   Apply Vaseline and Band-Aid daily until healed.    Due to recent changes in healthcare laws, you may see results of your pathology and/or laboratory studies on MyChart before the doctors have had a chance to review them. We understand that in some cases there may be results that are confusing or concerning to you. Please understand that not all results are received at the same time and often the doctors may need to interpret multiple results in order to provide you with the best plan of care or course of treatment. Therefore, we ask that you please give [place] 2 business days to thoroughly review all your results before contacting the office for clarification. Should we see a critical lab result, you will be contacted sooner.   If You Need Anything After Your Visit  If you have any questions or concerns for your doctor, please call our main line at [phone] and press option 4 to reach your doctor's medical assistant. If no one answers, please leave a voicemail as directed and we will return your call as soon as possible. Messages left after 4 pm will be answered the following business day.   You may also send [place] a message via [place]. We typically respond to MyChart messages within 1-2 business days.  For prescription refills, please ask your pharmacy to contact our office. Our fax number is  [phone].  If you have an urgent issue when the clinic is closed that cannot wait until the next business day, you can page your doctor at the number below.    Please note that while we do our best to be available for urgent issues outside of office hours, we are not available 24/7.   If you have an urgent issue and are unable to reach [place], you may choose to seek medical care at your doctor's office, retail clinic, urgent care center, or emergency room.  If you have a medical emergency, please immediately call 911 or go to the emergency department.  Pager Numbers  - Dr. Nehemiah Massed: [phone]  - Dr. Laurence Ferrari: [phone]  - Dr. Nicole Kindred: [phone]  In the event of inclement weather, please call our main line at [phone] for an update on the status of any delays or closures.  Dermatology Medication Tips: Please keep the boxes that topical medications come in in order to help keep track of the instructions about where and how to use these. Pharmacies typically print the medication instructions only on the boxes and not directly on the medication tubes.   If your medication is too expensive, please contact our office at [phone] option 4 or send [place] a message through [place].   We are unable to tell what your co-pay for medications will be in advance as this is different depending on your insurance coverage. However, we may be able to find a substitute medication at  lower cost or fill out paperwork to get insurance to cover a needed medication.   If a prior authorization is required to get your medication covered by your insurance company, please allow [place] 1-2 business days to complete this process.  Drug prices often vary depending on where the prescription is filled and some pharmacies may offer cheaper prices.  The website www.goodrx.com contains coupons for medications through different pharmacies. The prices here do not account for what the cost may be with help from  insurance (it may be cheaper with your insurance), but the website can give you the price if you did not use any insurance.  - You can print the associated coupon and take it with your prescription to the pharmacy.  - You may also stop by our office during regular business hours and pick up a GoodRx coupon card.  - If you need your prescription sent electronically to a different pharmacy, notify our office through [facility] or by phone at [phone] option 4.     Si Usted Necesita Algo Despus de Su Visita  Tambin puede enviarnos un mensaje a travs de Pharmacist, community. Por lo general respondemos a los mensajes de MyChart en el transcurso de 1 a 2 das hbiles.  Para renovar recetas, por favor pida a su farmacia que se ponga en contacto con nuestra oficina. Harland Dingwall de fax es [place] [phone].  Si tiene un asunto urgente cuando la clnica est cerrada y que no puede esperar hasta el siguiente da hbil, puede llamar/localizar a su doctor(a) al nmero que aparece a continuacin.   Por favor, tenga en cuenta que aunque hacemos todo lo posible para estar disponibles para asuntos urgentes fuera del horario de [place], no estamos disponibles las 24 horas del da, los 7 das de la [place].   Si tiene un problema urgente y no puede comunicarse con nosotros, puede optar por buscar atencin mdica  en el consultorio de su doctor(a), en una clnica privada, en un centro de atencin urgente o en una sala de emergencias.  Si tiene Engineering geologist, por favor llame inmediatamente al 911 o vaya a la sala de emergencias.  Nmeros de bper  - Dr. Nehemiah Massed: [phone]  - Dra. Moye: [phone]  - Dra. Nicole Kindred: [phone]  En caso de inclemencias del [place], por favor llame a Johnsie Kindred principal al [phone] para una actualizacin sobre el [place] de cualquier retraso o cierre.  Consejos para la medicacin en dermatologa: Por favor, guarde las cajas en las que vienen los  medicamentos de uso tpico para ayudarle a seguir las instrucciones sobre dnde y cmo usarlos. Las farmacias generalmente imprimen las instrucciones del medicamento slo en las cajas y no directamente en los tubos del [place].   Si su medicamento es muy caro, por favor, pngase en contacto con Zigmund Daniel llamando al [phone] y presione la opcin 4 o envenos un mensaje a travs de Pharmacist, community.   No podemos decirle cul ser su copago por los medicamentos por adelantado ya que esto es diferente dependiendo de la cobertura de su seguro. Sin embargo, es posible que podamos encontrar un medicamento sustituto a Electrical engineer un formulario para que el seguro cubra el medicamento que se considera necesario.   Si se requiere una autorizacin previa para que su compaa de seguros [place] su medicamento, por favor permtanos de 1 a 2 das hbiles para completar este proceso.  Los precios de los medicamentos varan con frecuencia dependiendo del Environmental consultant de dnde se  surte la receta y alguna farmacias pueden ofrecer precios ms baratos.  El sitio web www.goodrx.com tiene cupones para medicamentos de Airline pilot. Los precios aqu no tienen en cuenta lo que podra costar con la ayuda del seguro (puede ser ms barato con su seguro), pero el sitio web puede darle el precio si no utiliz Research scientist (physical sciences).  - Puede imprimir el cupn correspondiente y llevarlo con su receta a la farmacia.  - Tambin puede pasar por nuestra oficina durante el horario de atencin regular y Charity fundraiser una tarjeta de cupones de GoodRx.  - Si necesita que su receta se enve electrnicamente a una farmacia diferente, informe a nuestra oficina a travs de MyChart de [place] o por telfono llamando al [phone] y presione la opcin 4.

## 2022-07-18 NOTE — Progress Notes (Signed)
Follow-Up Visit   Subjective  Dwayne Smith is a 76 y.o. male who presents for the following: Actinic Keratosis (Scalp and right sideburn treated with LN2). The patient has spots, moles and lesions to be evaluated, some may be new or changing and the patient has concerns that these could be cancer.  The following portions of the chart were reviewed this encounter and updated as appropriate:   Tobacco  Allergies  Meds  Problems  Med Hx  Surg Hx  Fam Hx     Review of Systems:  No other skin or systemic complaints except as noted in HPI or Assessment and Plan.  Objective  Well appearing patient in no apparent distress; mood and affect are within normal limits.  A focused examination was performed including face, scalp, left arm. Relevant physical exam findings are noted in the Assessment and Plan.  Scalp (16) Erythematous thin papules/macules with gritty scale.   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  Actinic Damage - Severe, confluent actinic changes with pre-cancerous actinic keratoses  - Severe, chronic, not at goal, secondary to cumulative UV radiation exposure over time - diffuse scaly erythematous macules and papules with underlying dyspigmentation - Discussed Prescription "Field Treatment" for Severe, Chronic Confluent Actinic Changes with Pre-Cancerous Actinic Keratoses Field treatment involves treatment of an entire area of skin that has confluent Actinic Changes (Sun/ Ultraviolet light damage) and PreCancerous Actinic Keratoses by method of PhotoDynamic Therapy (PDT) and/or prescription Topical Chemotherapy agents such as 5-fluorouracil, 5-fluorouracil/calcipotriene, and/or imiquimod.  The purpose is to decrease the number of clinically evident and subclinical PreCancerous lesions to prevent progression to development of skin cancer by chemically destroying early precancer  changes that may or may not be visible.  It has been shown to reduce the risk of developing skin cancer in the treated area. As a result of treatment, redness, scaling, crusting, and open sores may occur during treatment course. One or more than one of these methods may be used and may have to be used several times to control, suppress and eliminate the PreCancerous changes. Discussed treatment course, expected reaction, and possible side effects. - Recommend daily broad spectrum sunscreen SPF 30+ to sun-exposed areas, reapply every 2 hours as needed.  - Staying in the shade or wearing long sleeves, sun glasses (UVA+UVB protection) and wide brim hats (4-inch brim around the entire circumference of the hat) are also recommended. - Call for new or changing lesions.  - Start 5-fluorouracil/calcipotriene cream twice a day for 7 days to affected areas including scalp. Prescription sent to [facility]. Patient advised they will receive an email to purchase the medication online and have it sent to their home. Patient provided with handout reviewing treatment course and side effects and advised to call or message [place] on MyChart with any concerns.   Destruction of lesion - Scalp Complexity: simple   Destruction method: cryotherapy   Informed consent: discussed and consent obtained   Timeout:  patient name, date of birth, surgical site, and procedure verified Lesion destroyed using liquid nitrogen: Yes   Region frozen until ice ball extended beyond lesion: Yes   Outcome: patient tolerated procedure well with no complications   Post-procedure details: wound care instructions given    Seborrheic dermatitis Scalp  Continue Ketoconazole 2% cream 3 nights a week, Monday, Wednesday and Fridays, Hydrocortisone 2.5% cream/lotion 3 nights a week, Tuesday, Thursday and Saturdays  Related Medications hydrocortisone 2.5 % cream Apply to aa's scalp QHS on Tuesday, Thursday, and  Saturday.  ketoconazole (NIZORAL) 2 % cream Apply to aa's scalp QHS on Monday, Wednesday, and Friday.  Inflamed seborrheic keratosis (19) Left Shoulder - Anterior  Destruction of lesion - Left Shoulder - Anterior Complexity: simple   Destruction method: cryotherapy   Informed consent: discussed and consent obtained   Timeout:  patient name, date of birth, surgical site, and procedure verified Lesion destroyed using liquid nitrogen: Yes   Region frozen until ice ball extended beyond lesion: Yes   Outcome: patient tolerated procedure well with no complications   Post-procedure details: wound care instructions given     Return in about 6 months (around 01/17/2023) for AK follow up.  I, Ashok Cordia, CMA, am acting as scribe for Sarina Ser, MD . Documentation: I have reviewed the above documentation for accuracy and completeness, and I agree with the above.  Sarina Ser, MD

## 2022-07-22 ENCOUNTER — Encounter: Payer: Self-pay | Admitting: Dermatology

## 2022-08-26 ENCOUNTER — Encounter: Payer: Self-pay | Admitting: *Deleted

## 2022-08-27 ENCOUNTER — Encounter: Payer: Self-pay | Admitting: *Deleted

## 2022-08-27 ENCOUNTER — Ambulatory Visit
Admission: RE | Admit: 2022-08-27 | Discharge: 2022-08-27 | Disposition: A | Discharge status: Home / Self Care | Payer: Medicare PPO | Source: Ambulatory Visit | Attending: Gastroenterology | Admitting: Gastroenterology

## 2022-08-27 ENCOUNTER — Ambulatory Visit: Payer: Medicare PPO | Admitting: Certified Registered Nurse Anesthetist

## 2022-08-27 ENCOUNTER — Encounter
Admission: RE | Disposition: A | Discharge status: Home / Self Care | Payer: Self-pay | Source: Ambulatory Visit | Attending: Gastroenterology

## 2022-08-27 DIAGNOSIS — E785 Hyperlipidemia, unspecified: Secondary | ICD-10-CM | POA: Diagnosis not present

## 2022-08-27 DIAGNOSIS — Z1211 Encounter for screening for malignant neoplasm of colon: Secondary | ICD-10-CM | POA: Insufficient documentation

## 2022-08-27 DIAGNOSIS — K449 Diaphragmatic hernia without obstruction or gangrene: Secondary | ICD-10-CM | POA: Insufficient documentation

## 2022-08-27 DIAGNOSIS — K648 Other hemorrhoids: Secondary | ICD-10-CM | POA: Insufficient documentation

## 2022-08-27 DIAGNOSIS — I10 Essential (primary) hypertension: Secondary | ICD-10-CM | POA: Diagnosis not present

## 2022-08-27 DIAGNOSIS — K219 Gastro-esophageal reflux disease without esophagitis: Secondary | ICD-10-CM | POA: Insufficient documentation

## 2022-08-27 DIAGNOSIS — N400 Enlarged prostate without lower urinary tract symptoms: Secondary | ICD-10-CM | POA: Diagnosis not present

## 2022-08-27 DIAGNOSIS — E669 Obesity, unspecified: Secondary | ICD-10-CM | POA: Insufficient documentation

## 2022-08-27 DIAGNOSIS — Z8719 Personal history of other diseases of the digestive system: Secondary | ICD-10-CM | POA: Insufficient documentation

## 2022-08-27 DIAGNOSIS — K5730 Diverticulosis of large intestine without perforation or abscess without bleeding: Secondary | ICD-10-CM | POA: Diagnosis not present

## 2022-08-27 DIAGNOSIS — K640 First degree hemorrhoids: Secondary | ICD-10-CM | POA: Insufficient documentation

## 2022-08-27 DIAGNOSIS — M1990 Unspecified osteoarthritis, unspecified site: Secondary | ICD-10-CM | POA: Diagnosis not present

## 2022-08-27 DIAGNOSIS — I739 Peripheral vascular disease, unspecified: Secondary | ICD-10-CM | POA: Insufficient documentation

## 2022-08-27 DIAGNOSIS — Z86010 Personal history of colonic polyps: Secondary | ICD-10-CM | POA: Insufficient documentation

## 2022-08-27 HISTORY — PX: COLONOSCOPY WITH PROPOFOL: SHX5780

## 2022-08-27 HISTORY — PX: ESOPHAGOGASTRODUODENOSCOPY (EGD) WITH PROPOFOL: SHX5813

## 2022-08-27 SURGERY — COLONOSCOPY WITH PROPOFOL
Anesthesia: General

## 2022-08-27 MED ORDER — PROPOFOL 10 MG/ML IV BOLUS
INTRAVENOUS | Status: DC | PRN
  Administered 2022-08-27: 30 mg via INTRAVENOUS
  Administered 2022-08-27: 70 mg via INTRAVENOUS

## 2022-08-27 MED ORDER — SODIUM CHLORIDE 0.9 % IV SOLN
INTRAVENOUS | Status: DC
  Administered 2022-08-27: 1000 mL via INTRAVENOUS

## 2022-08-27 MED ORDER — PROPOFOL 500 MG/50ML IV EMUL
INTRAVENOUS | Status: DC | PRN
  Administered 2022-08-27: 150 ug/kg/min via INTRAVENOUS

## 2022-08-27 MED ORDER — LIDOCAINE HCL (CARDIAC) PF 100 MG/5ML IV SOSY
PREFILLED_SYRINGE | INTRAVENOUS | Status: DC | PRN
  Administered 2022-08-27: 50 mg via INTRAVENOUS

## 2022-08-27 MED ORDER — DEXMEDETOMIDINE HCL IN NACL 80 MCG/20ML IV SOLN
INTRAVENOUS | Status: DC | PRN
  Administered 2022-08-27: 8 ug via BUCCAL

## 2022-08-27 MED ORDER — STERILE WATER FOR IRRIGATION IR SOLN
Status: DC | PRN
  Administered 2022-08-27: 60 mL

## 2022-08-27 NOTE — Op Note (Signed)
[facility] Patient Name: Dwayne Smith Procedure Date: 08/27/2022 10:37 AM MRN: [id number] Account #: [id number] Date of Birth: 19-Dec-1945 Admit Type: Outpatient Age: 76 Room: [facility] ENDO ROOM 1 Gender: Male Note Status: Finalized Instrument Name: Jasper Riling [id number] Procedure:             Colonoscopy Indications:           Surveillance: Personal history of adenomatous polyps                         on last colonoscopy > 3 years ago Providers:             Andrey Farmer MD, MD Referring MD:          Leonie Douglas. Doy Hutching, MD (Referring MD) Medicines:             Monitored Anesthesia Care Complications:         No immediate complications. Procedure:             Pre-Anesthesia Assessment:                        - Prior to the procedure, a History and Physical was                         performed, and patient medications and allergies were                         reviewed. The patient is competent. The risks and                         benefits of the procedure and the sedation options and                         risks were discussed with the patient. All questions                         were answered and informed consent was obtained.                         Patient identification and proposed procedure were                         verified by the physician, the nurse, the                         anesthesiologist, the anesthetist and the technician                         in the endoscopy suite. Mental Status Examination:                         alert and oriented. Airway Examination: normal                         oropharyngeal airway and neck mobility. Respiratory                         Examination: clear to auscultation. CV Examination:  normal. Prophylactic Antibiotics: The patient does not                         require prophylactic antibiotics. Prior                         Anticoagulants: The patient has taken no  anticoagulant                         or antiplatelet agents except for aspirin. ASA Grade                         Assessment: III - A patient with severe systemic                         disease. After reviewing the risks and benefits, the                         patient was deemed in satisfactory condition to                         undergo the procedure. The anesthesia plan was to use                         monitored anesthesia care (MAC). Immediately prior to                         administration of medications, the patient was                         re-assessed for adequacy to receive sedatives. The                         heart rate, respiratory rate, oxygen saturations,                         blood pressure, adequacy of pulmonary ventilation, and                         response to care were monitored throughout the                         procedure. The physical status of the patient was                         re-assessed after the procedure.                        After obtaining informed consent, the colonoscope was                         passed under direct vision. Throughout the procedure,                         the patient's blood pressure, pulse, and oxygen                         saturations were monitored continuously. The  Colonoscope was introduced through the anus and                         advanced to the the cecum, identified by appendiceal                         orifice and ileocecal valve. The colonoscopy was                         performed without difficulty. The patient tolerated                         the procedure well. The quality of the bowel                         preparation was adequate to identify polyps. The                         ileocecal valve, appendiceal orifice, and rectum were                         photographed. Findings:      The perianal and digital rectal examinations were normal.      Multiple  small-mouthed diverticula were found in the sigmoid colon,       descending colon and ascending colon.      Internal hemorrhoids were found during retroflexion. The hemorrhoids       were Grade I (internal hemorrhoids that do not prolapse).      The exam was otherwise without abnormality on direct and retroflexion       views. Impression:            - Diverticulosis in the sigmoid colon, in the                         descending colon and in the ascending colon.                        - Internal hemorrhoids.                        - The examination was otherwise normal on direct and                         retroflexion views.                        - No specimens collected. Recommendation:        - Discharge patient to home.                        - Resume previous diet.                        - Continue present medications.                        - Repeat colonoscopy is not recommended due to current                         age (61 years or older) for  surveillance.                        - Return to referring physician as previously                         scheduled. Procedure Code(s):     --- Professional ---                        M0349, Colorectal cancer screening; colonoscopy on                         individual at high risk Diagnosis Code(s):     --- Professional ---                        Z86.010, Personal history of colonic polyps                        K64.0, First degree hemorrhoids                        K57.30, Diverticulosis of large intestine without                         perforation or abscess without bleeding CPT copyright 2022 American Medical Association. All rights reserved. The codes documented in this report are preliminary and upon coder review may  be revised to meet current compliance requirements. Andrey Farmer MD, MD 08/27/2022 11:41:37 AM Number of Addenda: 0 Note Initiated On: 08/27/2022 10:37 AM Scope Withdrawal Time: 0 hours 8 minutes 9 seconds   Total Procedure Duration: 0 hours 11 minutes 34 seconds  Estimated Blood Loss:  Estimated blood loss: none.      [facility]

## 2022-08-27 NOTE — Op Note (Signed)
[facility] Patient Name: Dwayne Smith Procedure Date: 08/27/2022 10:37 AM MRN: [id number] Account #: [id number] Date of Birth: 10/14/1945 Admit Type: Outpatient Age: 76 Room: [facility] ENDO ROOM 1 Gender: Male Note Status: Finalized Instrument Name: Upper Endoscope [id number] Procedure:             Upper GI endoscopy Indications:           Gastro-esophageal reflux disease Providers:             Andrey Farmer MD, MD Referring MD:          Leonie Douglas. Doy Hutching, MD (Referring MD) Medicines:             Monitored Anesthesia Care Complications:         No immediate complications. Procedure:             Pre-Anesthesia Assessment:                        - Prior to the procedure, a History and Physical was                         performed, and patient medications and allergies were                         reviewed. The patient is competent. The risks and                         benefits of the procedure and the sedation options and                         risks were discussed with the patient. All questions                         were answered and informed consent was obtained.                         Patient identification and proposed procedure were                         verified by the physician, the nurse, the                         anesthesiologist, the anesthetist and the technician                         in the endoscopy suite. Mental Status Examination:                         alert and oriented. Airway Examination: normal                         oropharyngeal airway and neck mobility. Respiratory                         Examination: clear to auscultation. CV Examination:                         normal. Prophylactic Antibiotics: The patient does not  require prophylactic antibiotics. Prior                         Anticoagulants: The patient has taken no anticoagulant                         or antiplatelet agents except for  aspirin. ASA Grade                         Assessment: III - A patient with severe systemic                         disease. After reviewing the risks and benefits, the                         patient was deemed in satisfactory condition to                         undergo the procedure. The anesthesia plan was to use                         monitored anesthesia care (MAC). Immediately prior to                         administration of medications, the patient was                         re-assessed for adequacy to receive sedatives. The                         heart rate, respiratory rate, oxygen saturations,                         blood pressure, adequacy of pulmonary ventilation, and                         response to care were monitored throughout the                         procedure. The physical status of the patient was                         re-assessed after the procedure.                        After obtaining informed consent, the endoscope was                         passed under direct vision. Throughout the procedure,                         the patient's blood pressure, pulse, and oxygen                         saturations were monitored continuously. The                         Endosonoscope was introduced through the mouth, and  advanced to the second part of duodenum. The upper GI                         endoscopy was accomplished without difficulty. The                         patient tolerated the procedure well. Findings:      The esophagus and gastroesophageal junction were examined with white       light and narrow band imaging (NBI). There was no visual evidence of       Barrett's esophagus.      A small hiatal hernia was present.      The entire examined stomach was normal.      The examined duodenum was normal. Impression:            - There is no endoscopic evidence of Barrett's                         esophagus.                         - Small hiatal hernia.                        - Normal stomach.                        - Normal examined duodenum.                        - No specimens collected. Recommendation:        - Discharge patient to home.                        - Resume previous diet.                        - Continue present medications.                        - Return to referring physician as previously                         scheduled. Procedure Code(s):     --- Professional ---                        [phone], Esophagogastroduodenoscopy, flexible,                         transoral; diagnostic, including collection of                         specimen(s) by brushing or washing, when performed                         (separate procedure) Diagnosis Code(s):     --- Professional ---                        K44.9, Diaphragmatic hernia without obstruction or  gangrene                        K21.9, Gastro-esophageal reflux disease without                         esophagitis CPT copyright 2022 American Medical Association. All rights reserved. The codes documented in this report are preliminary and upon coder review may  be revised to meet current compliance requirements. Andrey Farmer MD, MD 08/27/2022 11:37:52 AM Number of Addenda: 0 Note Initiated On: 08/27/2022 10:37 AM Estimated Blood Loss:  Estimated blood loss: none.      [facility]

## 2022-08-27 NOTE — Anesthesia Procedure Notes (Signed)
Date/Time: 08/27/2022 11:00 AM  Performed by: Johnna Acosta, CRNAPre-anesthesia Checklist: Patient identified, Emergency Drugs available, Suction available, Patient being monitored and Timeout performed Patient Re-evaluated:Patient Re-evaluated prior to induction Oxygen Delivery Method: Nasal cannula Preoxygenation: Pre-oxygenation with 100% oxygen Induction Type: IV induction

## 2022-08-27 NOTE — H&P (Signed)
Outpatient short stay form Pre-procedure 08/27/2022  Lesly Rubenstein, MD  Primary Physician: Idelle Crouch, MD  Reason for visit:  Barrett's/surveillance colonoscopy  History of present illness:    76 y/o gentleman with history of hypertension, arthritis, and HLD here for EGD for history of barrett's esophagus and history of polyps. No blood thinners. No family history of GI malignancies. History of umbilical hernia repair.    Current Facility-Administered Medications:    0.9 %  sodium chloride infusion, , Intravenous, Continuous, Franchot Pollitt, Hilton Cork, MD  Medications Prior to Admission  Medication Sig Dispense Refill Last Dose   amLODipine-benazepril (LOTREL) 5-10 MG capsule Take 1 capsule by mouth daily.   08/27/2022 at 1200   aspirin 325 MG tablet Take 325 mg by mouth daily.   Past Week   finasteride (PROSCAR) 5 MG tablet Take 5 mg by mouth daily.   08/26/2022   gabapentin (NEURONTIN) 400 MG capsule    08/26/2022   hydrocortisone 2.5 % cream Apply to aa's scalp QHS on Tuesday, Thursday, and Saturday. 28 g 3 08/26/2022   ketoconazole (NIZORAL) 2 % cream Apply to aa's scalp QHS on Monday, Wednesday, and Friday. 60 g 3 08/26/2022   simvastatin (ZOCOR) 20 MG tablet Take 20 mg by mouth at bedtime.   08/26/2022   terazosin (HYTRIN) 5 MG capsule Take 5 mg by mouth at bedtime.   08/26/2022   acetaminophen (TYLENOL) 325 MG tablet Take 2 tablets (650 mg total) by mouth every 4 (four) hours as needed for mild pain ((score 1 to 3) or temp > 100.5). 60 tablet 0  at prn   methocarbamol (ROBAXIN) 500 MG tablet Take 1 tablet (500 mg total) by mouth every 6 (six) hours as needed for muscle spasms. (Patient not taking: Reported on 08/27/2022) 60 tablet 0 Not Taking   nabumetone (RELAFEN) 500 MG tablet Take 500 mg by mouth 2 (two) times daily.      oxyCODONE (OXY IR/ROXICODONE) 5 MG immediate release tablet Take 1 tablet (5 mg total) by mouth every 3 (three) hours as needed for moderate pain ((score 4 to  6)). (Patient not taking: Reported on 12/11/2021) 30 tablet 0    tadalafil (CIALIS) 5 MG tablet Take 5 mg by mouth daily as needed for erectile dysfunction.    at prn     No Known Allergies   Past Medical History:  Diagnosis Date   Arthritis    Bell's palsy    BPH (benign prostatic hyperplasia)    Cataract cortical, senile    DVT of lower extremity (deep venous thrombosis) (HCC)    Dysplastic nevus 02/04/2019   left low back 6.0 cm lat to spine, moderate atypia    GERD (gastroesophageal reflux disease)    Hyperglycemia    PONV (postoperative nausea and vomiting)    shoulder surgery at [facility]   Precancerous skin lesion    PVD (peripheral vascular disease) ([facility])     Review of systems:  Otherwise negative.    Physical Exam  Gen: Alert, oriented. Appears stated age.  HEENT: PERRLA. Lungs: No respiratory distress CV: RRR Abd: soft, benign, no masses Ext: No edema    Planned procedures: Proceed with EGD/colonoscopy. The patient understands the nature of the planned procedure, indications, risks, alternatives and potential complications including but not limited to bleeding, infection, perforation, damage to internal organs and possible oversedation/side effects from anesthesia. The patient agrees and gives consent to proceed.  Please refer to procedure notes for findings, recommendations and patient disposition/instructions.  Lesly Rubenstein, MD [facility]

## 2022-08-27 NOTE — Anesthesia Preprocedure Evaluation (Signed)
Anesthesia Evaluation  Patient identified by MRN, date of birth, ID band Patient awake    Reviewed: Allergy & Precautions, NPO status , Patient's Chart, lab work & pertinent test results  History of Anesthesia Complications (+) PONV and history of anesthetic complications  Airway Mallampati: II  TM Distance: >3 FB Neck ROM: Full    Dental  (+) Poor Dentition, Dental Advidsory Given   Pulmonary neg shortness of breath, neg sleep apnea, neg COPD, neg recent URI, former smoker   breath sounds clear to auscultation- rhonchi (-) wheezing      Cardiovascular Exercise Tolerance: Good (-) hypertension(-) angina + Peripheral Vascular Disease and + DVT  (-) CAD, (-) Past MI, (-) Cardiac Stents and (-) CABG  Rhythm:Regular Rate:Normal - Systolic murmurs and - Diastolic murmurs    Neuro/Psych neg Seizures  Neuromuscular disease  negative psych ROS   GI/Hepatic Neg liver ROS,GERD  ,,  Endo/Other  negative endocrine ROSneg diabetes    Renal/GU negative Renal ROS     Musculoskeletal  (+) Arthritis ,    Abdominal  (+) - obese  Peds negative pediatric ROS (+)  Hematology negative hematology ROS (+)   Anesthesia Other Findings Past Medical History: No date: Arthritis No date: BPH (benign prostatic hyperplasia) No date: Cataract cortical, senile No date: Hyperglycemia No date: PONV (postoperative nausea and vomiting)     Comment:  shoulder surgery at [facility] No date: PVD (peripheral vascular disease) (HCC)   Reproductive/Obstetrics                             Anesthesia Physical Anesthesia Plan  ASA: 3  Anesthesia Plan: General   Post-op Pain Management:    Induction: Intravenous  PONV Risk Score and Plan: Propofol infusion and TIVA  Airway Management Planned: Natural Airway and Nasal Cannula  Additional Equipment:   Intra-op Plan:   Post-operative Plan:   Informed Consent: I have reviewed  the patients History and Physical, chart, labs and discussed the procedure including the risks, benefits and alternatives for the proposed anesthesia with the patient or authorized representative who has indicated his/her understanding and acceptance.     Dental advisory given  Plan Discussed with: CRNA and Anesthesiologist  Anesthesia Plan Comments:         Anesthesia Quick Evaluation

## 2022-08-27 NOTE — Transfer of Care (Signed)
Immediate Anesthesia Transfer of Care Note  Patient: Dwayne Smith  Procedure(s) Performed: COLONOSCOPY WITH PROPOFOL ESOPHAGOGASTRODUODENOSCOPY (EGD) WITH PROPOFOL  Patient Location: PACU  Anesthesia Type:General  Level of Consciousness: sedated  Airway & Oxygen Therapy: Patient Spontanous Breathing  Post-op Assessment: Report given to RN and Post -op Vital signs reviewed and stable  Post vital signs: Reviewed and stable  Last Vitals:  Vitals Value Taken Time  BP 92/60 08/27/22 1131  Temp 35.6 C 08/27/22 1130  Pulse 65 08/27/22 1131  Resp 16 08/27/22 1131  SpO2 98 % 08/27/22 1131    Last Pain:  Vitals:   08/27/22 1130  TempSrc: Temporal  PainSc: Asleep         Complications: No notable events documented.

## 2022-08-27 NOTE — Interval H&P Note (Signed)
History and Physical Interval Note:  08/27/2022 10:45 AM  Dwayne Smith  has presented today for surgery, with the diagnosis of BARRETT'S ESOPHAGUS,HX OF ADENOMATOUS POLYP.  The various methods of treatment have been discussed with the patient and family. After consideration of risks, benefits and other options for treatment, the patient has consented to  Procedure(s): COLONOSCOPY WITH PROPOFOL (N/A) ESOPHAGOGASTRODUODENOSCOPY (EGD) WITH PROPOFOL (N/A) as a surgical intervention.  The patient's history has been reviewed, patient examined, no change in status, stable for surgery.  I have reviewed the patient's chart and labs.  Questions were answered to the patient's satisfaction.     Lesly Rubenstein  Ok to proceed with EGD/Colonoscopy

## 2022-08-28 ENCOUNTER — Encounter: Payer: Self-pay | Admitting: Gastroenterology

## 2022-08-29 NOTE — Anesthesia Postprocedure Evaluation (Signed)
Anesthesia Post Note  Patient: Dwayne Smith  Procedure(s) Performed: COLONOSCOPY WITH PROPOFOL ESOPHAGOGASTRODUODENOSCOPY (EGD) WITH PROPOFOL  Patient location during evaluation: Endoscopy Anesthesia Type: General Level of consciousness: awake and alert Pain management: pain level controlled Vital Signs Assessment: post-procedure vital signs reviewed and stable Respiratory status: spontaneous breathing, nonlabored ventilation, respiratory function stable and patient connected to nasal cannula oxygen Cardiovascular status: blood pressure returned to baseline and stable Postop Assessment: no apparent nausea or vomiting Anesthetic complications: no   No notable events documented.   Last Vitals:  Vitals:   08/27/22 1140 08/27/22 1150  BP:    Pulse:    Resp:    Temp:    SpO2: 99% 100%    Last Pain:  Vitals:   08/27/22 1150  TempSrc:   PainSc: 0-No pain                 Martha Clan

## 2023-01-30 ENCOUNTER — Ambulatory Visit (INDEPENDENT_AMBULATORY_CARE_PROVIDER_SITE_OTHER): Payer: Medicare PPO | Admitting: Dermatology

## 2023-01-30 VITALS — BP 117/64

## 2023-01-30 DIAGNOSIS — D692 Other nonthrombocytopenic purpura: Secondary | ICD-10-CM

## 2023-01-30 DIAGNOSIS — X32XXXA Exposure to sunlight, initial encounter: Secondary | ICD-10-CM

## 2023-01-30 DIAGNOSIS — L570 Actinic keratosis: Secondary | ICD-10-CM

## 2023-01-30 DIAGNOSIS — Z79899 Other long term (current) drug therapy: Secondary | ICD-10-CM

## 2023-01-30 DIAGNOSIS — Z7189 Other specified counseling: Secondary | ICD-10-CM

## 2023-01-30 DIAGNOSIS — W908XXA Exposure to other nonionizing radiation, initial encounter: Secondary | ICD-10-CM | POA: Diagnosis not present

## 2023-01-30 DIAGNOSIS — L219 Seborrheic dermatitis, unspecified: Secondary | ICD-10-CM

## 2023-01-30 DIAGNOSIS — L578 Other skin changes due to chronic exposure to nonionizing radiation: Secondary | ICD-10-CM

## 2023-01-30 DIAGNOSIS — L820 Inflamed seborrheic keratosis: Secondary | ICD-10-CM

## 2023-01-30 DIAGNOSIS — L814 Other melanin hyperpigmentation: Secondary | ICD-10-CM

## 2023-01-30 DIAGNOSIS — L821 Other seborrheic keratosis: Secondary | ICD-10-CM

## 2023-01-30 NOTE — Progress Notes (Signed)
Follow-Up Visit   Subjective  Dwayne Smith is a 77 y.o. male who presents for the following: 6 month AK follow up of scalp treated with LN2 and 5FU/Calcipotriene  The patient has spots, moles and lesions to be evaluated, some may be new or changing and the patient may have concern these could be cancer.  The following portions of the chart were reviewed this encounter and updated as appropriate: medications, allergies, medical history  Review of Systems:  No other skin or systemic complaints except as noted in HPI or Assessment and Plan.  Objective  Well appearing patient in no apparent distress; mood and affect are within normal limits. A focused examination was performed of the following areas: Face, scalp Relevant exam findings are noted in the Assessment and Plan.  Scalp (8) Erythematous thin papules/macules with gritty scale.   Scalp (4) Erythematous stuck-on, waxy papule or plaque   Assessment & Plan   ACTINIC DAMAGE - chronic, secondary to cumulative UV radiation exposure/sun exposure over time - diffuse scaly erythematous macules with underlying dyspigmentation - Recommend daily broad spectrum sunscreen SPF 30+ to sun-exposed areas, reapply every 2 hours as needed.  - Recommend staying in the shade or wearing long sleeves, sun glasses (UVA+UVB protection) and wide brim hats (4-inch brim around the entire circumference of the hat). - Call for new or changing lesions.  Purpura - Chronic; persistent and recurrent.  Treatable, but not curable. - Violaceous macules and patches - Benign - Related to trauma, age, sun damage and/or use of blood thinners, chronic use of topical and/or oral steroids - Observe - Can use OTC arnica containing moisturizer such as Dermend Bruise Formula if desired - Call for worsening or other concerns  LENTIGINES Exam: scattered tan macules Due to sun exposure Treatment Plan: Benign-appearing, observe. Recommend daily broad spectrum  sunscreen SPF 30+ to sun-exposed areas, reapply every 2 hours as needed.  Call for any changes  SEBORRHEIC KERATOSIS - Stuck-on, waxy, tan-brown papules and/or plaques  - Benign-appearing - Discussed benign etiology and prognosis. - Observe - Call for any changes  SEBORRHEIC DERMATITIS Exam: Pink patches with greasy scale at scalp  Chronic and persistent condition with duration or expected duration over one year. Condition is symptomatic/ bothersome to patient. Not currently at goal. Seborrheic Dermatitis is a chronic persistent rash characterized by pinkness and scaling most commonly of the mid face but also can occur on the scalp (dandruff), ears; mid chest, mid back and groin.  It tends to be exacerbated by stress and cooler weather.  People who have neurologic disease may experience new onset or exacerbation of existing seborrheic dermatitis.  The condition is not curable but treatable and can be controlled.  Start Ketoconazole 2% shampoo qd 3 times per week prn  AK (actinic keratosis) (8) Scalp  Destruction of lesion - Scalp Complexity: simple   Destruction method: cryotherapy   Informed consent: discussed and consent obtained   Timeout:  patient name, date of birth, surgical site, and procedure verified Lesion destroyed using liquid nitrogen: Yes   Region frozen until ice ball extended beyond lesion: Yes   Outcome: patient tolerated procedure well with no complications   Post-procedure details: wound care instructions given    Inflamed seborrheic keratosis (4) Scalp  Destruction of lesion - Scalp Complexity: simple   Destruction method: cryotherapy   Informed consent: discussed and consent obtained   Timeout:  patient name, date of birth, surgical site, and procedure verified Lesion destroyed using liquid nitrogen: Yes  Region frozen until ice ball extended beyond lesion: Yes   Outcome: patient tolerated procedure well with no complications   Post-procedure details:  wound care instructions given     Return in about 6 months (around 08/02/2023) for AK follow up.  I, Joanie Coddington, CMA, am acting as scribe for Armida Sans, MD .  Documentation: I have reviewed the above documentation for accuracy and completeness, and I agree with the above.  Armida Sans, MD

## 2023-01-30 NOTE — Patient Instructions (Signed)
Cryotherapy Aftercare  Wash gently with soap and water everyday.   Apply Vaseline and Band-Aid daily until healed.     Due to recent changes in healthcare laws, you may see results of your pathology and/or laboratory studies on MyChart before the doctors have had a chance to review them. We understand that in some cases there may be results that are confusing or concerning to you. Please understand that not all results are received at the same time and often the doctors may need to interpret multiple results in order to provide you with the best plan of care or course of treatment. Therefore, we ask that you please give us 2 business days to thoroughly review all your results before contacting the office for clarification. Should we see a critical lab result, you will be contacted sooner.   If You Need Anything After Your Visit  If you have any questions or concerns for your doctor, please call our main line at [phone] and press option 4 to reach your doctor's medical assistant. If no one answers, please leave a voicemail as directed and we will return your call as soon as possible. Messages left after 4 pm will be answered the following business day.   You may also send us a message via MyChart. We typically respond to MyChart messages within 1-2 business days.  For prescription refills, please ask your pharmacy to contact our office. Our fax number is [phone].  If you have an urgent issue when the clinic is closed that cannot wait until the next business day, you can page your doctor at the number below.    Please note that while we do our best to be available for urgent issues outside of office hours, we are not available 24/7.   If you have an urgent issue and are unable to reach us, you may choose to seek medical care at your doctor's office, retail clinic, urgent care center, or emergency room.  If you have a medical emergency, please immediately call 911 or go to the  emergency department.  Pager Numbers  - Dr. Kowalski: [phone]  - Dr. Moye: [phone]  - Dr. Stewart: [phone]  In the event of inclement weather, please call our main line at [phone] for an update on the status of any delays or closures.  Dermatology Medication Tips: Please keep the boxes that topical medications come in in order to help keep track of the instructions about where and how to use these. Pharmacies typically print the medication instructions only on the boxes and not directly on the medication tubes.   If your medication is too expensive, please contact our office at [phone] option 4 or send us a message through MyChart.   We are unable to tell what your co-pay for medications will be in advance as this is different depending on your insurance coverage. However, we may be able to find a substitute medication at lower cost or fill out paperwork to get insurance to cover a needed medication.   If a prior authorization is required to get your medication covered by your insurance company, please allow us 1-2 business days to complete this process.  Drug prices often vary depending on where the prescription is filled and some pharmacies may offer cheaper prices.  The website www.goodrx.com contains coupons for medications through different pharmacies. The prices here do not account for what the cost may be with help from insurance (it may be cheaper with your insurance), but the website can   give you the price if you did not use any insurance.  - You can print the associated coupon and take it with your prescription to the pharmacy.  - You may also stop by our office during regular business hours and pick up a GoodRx coupon card.  - If you need your prescription sent electronically to a different pharmacy, notify our office through [facility] MyChart or by phone at [phone] option 4.     Si Usted Necesita Algo Despus de Su Visita  Tambin puede  enviarnos un mensaje a travs de MyChart. Por lo general respondemos a los mensajes de MyChart en el transcurso de 1 a 2 das hbiles.  Para renovar recetas, por favor pida a su farmacia que se ponga en contacto con nuestra oficina. Nuestro nmero de fax es el [phone].  Si tiene un asunto urgente cuando la clnica est cerrada y que no puede esperar hasta el siguiente da hbil, puede llamar/localizar a su doctor(a) al nmero que aparece a continuacin.   Por favor, tenga en cuenta que aunque hacemos todo lo posible para estar disponibles para asuntos urgentes fuera del horario de oficina, no estamos disponibles las 24 horas del da, los 7 das de la semana.   Si tiene un problema urgente y no puede comunicarse con nosotros, puede optar por buscar atencin mdica  en el consultorio de su doctor(a), en una clnica privada, en un centro de atencin urgente o en una sala de emergencias.  Si tiene una emergencia mdica, por favor llame inmediatamente al 911 o vaya a la sala de emergencias.  Nmeros de bper  - Dr. Kowalski: [phone]  - Dra. Moye: [phone]  - Dra. Stewart: [phone]  En caso de inclemencias del tiempo, por favor llame a nuestra lnea principal al [phone] para una actualizacin sobre el estado de cualquier retraso o cierre.  Consejos para la medicacin en dermatologa: Por favor, guarde las cajas en las que vienen los medicamentos de uso tpico para ayudarle a seguir las instrucciones sobre dnde y cmo usarlos. Las farmacias generalmente imprimen las instrucciones del medicamento slo en las cajas y no directamente en los tubos del medicamento.   Si su medicamento es muy caro, por favor, pngase en contacto con nuestra oficina llamando al [phone] y presione la opcin 4 o envenos un mensaje a travs de MyChart.   No podemos decirle cul ser su copago por los medicamentos por adelantado ya que esto es diferente dependiendo de la cobertura de su seguro.  Sin embargo, es posible que podamos encontrar un medicamento sustituto a menor costo o llenar un formulario para que el seguro cubra el medicamento que se considera necesario.   Si se requiere una autorizacin previa para que su compaa de seguros cubra su medicamento, por favor permtanos de 1 a 2 das hbiles para completar este proceso.  Los precios de los medicamentos varan con frecuencia dependiendo del lugar de dnde se surte la receta y alguna farmacias pueden ofrecer precios ms baratos.  El sitio web www.goodrx.com tiene cupones para medicamentos de diferentes farmacias. Los precios aqu no tienen en cuenta lo que podra costar con la ayuda del seguro (puede ser ms barato con su seguro), pero el sitio web puede darle el precio si no utiliz ningn seguro.  - Puede imprimir el cupn correspondiente y llevarlo con su receta a la farmacia.  - Tambin puede pasar por nuestra oficina durante el horario de atencin regular y recoger una tarjeta de cupones de GoodRx.  -   Si necesita que su receta se enve electrnicamente a una farmacia diferente, informe a nuestra oficina a travs de MyChart de [facility] o por telfono llamando al [phone] y presione la opcin 4.  

## 2023-02-05 MED ORDER — KETOCONAZOLE 2 % EX SHAM: 1.0000 | MEDICATED_SHAMPOO | CUTANEOUS | 11 refills | Status: AC

## 2023-02-08 ENCOUNTER — Encounter: Payer: Self-pay | Admitting: Dermatology

## 2023-07-24 ENCOUNTER — Ambulatory Visit: Payer: Medicare PPO | Admitting: Dermatology

## 2023-07-24 ENCOUNTER — Encounter: Payer: Self-pay | Admitting: Dermatology

## 2023-07-24 DIAGNOSIS — L570 Actinic keratosis: Secondary | ICD-10-CM | POA: Diagnosis not present

## 2023-07-24 DIAGNOSIS — L821 Other seborrheic keratosis: Secondary | ICD-10-CM | POA: Diagnosis not present

## 2023-07-24 DIAGNOSIS — D3610 Benign neoplasm of peripheral nerves and autonomic nervous system, unspecified: Secondary | ICD-10-CM

## 2023-07-24 DIAGNOSIS — L578 Other skin changes due to chronic exposure to nonionizing radiation: Secondary | ICD-10-CM

## 2023-07-24 DIAGNOSIS — W908XXA Exposure to other nonionizing radiation, initial encounter: Secondary | ICD-10-CM | POA: Diagnosis not present

## 2023-07-24 DIAGNOSIS — L820 Inflamed seborrheic keratosis: Secondary | ICD-10-CM | POA: Diagnosis not present

## 2023-07-24 DIAGNOSIS — Z79899 Other long term (current) drug therapy: Secondary | ICD-10-CM

## 2023-07-24 DIAGNOSIS — Z7189 Other specified counseling: Secondary | ICD-10-CM

## 2023-07-24 DIAGNOSIS — D3612 Benign neoplasm of peripheral nerves and autonomic nervous system, upper limb, including shoulder: Secondary | ICD-10-CM

## 2023-07-24 DIAGNOSIS — L219 Seborrheic dermatitis, unspecified: Secondary | ICD-10-CM | POA: Diagnosis not present

## 2023-07-24 MED ORDER — KETOCONAZOLE 2 % EX CREA: TOPICAL_CREAM | CUTANEOUS | 11 refills | Status: AC

## 2023-07-24 NOTE — Progress Notes (Signed)
Follow-Up Visit   Subjective  Dwayne Smith is a 77 y.o. male who presents for the following: 6 month AK follow up. AK's at scalp treated with LN2 at last visit. Patient has also been treated with PDT and topical 5FU/calcipotriene.   The patient has spots, moles and lesions to be evaluated, some may be new or changing and the patient may have concern these could be cancer.  Patient also with hx of seb derm at scalp and would like ketoconazole cream refill.   The following portions of the chart were reviewed this encounter and updated as appropriate: medications, allergies, medical history  Review of Systems:  No other skin or systemic complaints except as noted in HPI or Assessment and Plan.  Objective  Well appearing patient in no apparent distress; mood and affect are within normal limits.   A focused examination was performed of the following areas: Scalp, face  Relevant exam findings are noted in the Assessment and Plan.  Scalp (2) Erythematous thin papules/macules with gritty scale.   left nasal root Erythematous stuck-on, waxy papule or plaque    Assessment & Plan   SEBORRHEIC DERMATITIS Exam: Pink patches with greasy scale at chest  Chronic and persistent condition with duration or expected duration over one year. Condition is bothersome/symptomatic for patient. Currently flared at chest.   Seborrheic Dermatitis is a chronic persistent rash characterized by pinkness and scaling most commonly of the mid face but also can occur on the scalp (dandruff), ears; mid chest, mid back and groin.  It tends to be exacerbated by stress and cooler weather.  People who have neurologic disease may experience new onset or exacerbation of existing seborrheic dermatitis.  The condition is not curable but treatable and can be controlled.  Treatment Plan: Continue ketoconazole 2% cream daily as needed. Recommend patient use it Monday, Wednesday and Fridays at bed time.   ACTINIC  DAMAGE - chronic, secondary to cumulative UV radiation exposure/sun exposure over time - diffuse scaly erythematous macules with underlying dyspigmentation - Recommend daily broad spectrum sunscreen SPF 30+ to sun-exposed areas, reapply every 2 hours as needed.  - Recommend staying in the shade or wearing long sleeves, sun glasses (UVA+UVB protection) and wide brim hats (4-inch brim around the entire circumference of the hat). - Call for new or changing lesions.  SEBORRHEIC KERATOSIS - Stuck-on, waxy, tan-brown papules and/or plaques  - Benign-appearing - Discussed benign etiology and prognosis. - Observe - Call for any changes   AK (actinic keratosis) (2) Scalp  Actinic keratoses are precancerous spots that appear secondary to cumulative UV radiation exposure/sun exposure over time. They are chronic with expected duration over 1 year. A portion of actinic keratoses will progress to squamous cell carcinoma of the skin. It is not possible to reliably predict which spots will progress to skin cancer and so treatment is recommended to prevent development of skin cancer.  Recommend daily broad spectrum sunscreen SPF 30+ to sun-exposed areas, reapply every 2 hours as needed.  Recommend staying in the shade or wearing long sleeves, sun glasses (UVA+UVB protection) and wide brim hats (4-inch brim around the entire circumference of the hat). Call for new or changing lesions.   Destruction of lesion - Scalp (2) Complexity: simple   Destruction method: cryotherapy   Informed consent: discussed and consent obtained   Timeout:  patient name, date of birth, surgical site, and procedure verified Lesion destroyed using liquid nitrogen: Yes   Region frozen until ice ball extended beyond  lesion: Yes   Outcome: patient tolerated procedure well with no complications   Post-procedure details: wound care instructions given    Seborrheic dermatitis  Related Medications hydrocortisone 2.5 %  cream Apply to aa's scalp QHS on Tuesday, Thursday, and Saturday.  ketoconazole (NIZORAL) 2 % cream Apply to aa's scalp QHS on Monday, Wednesday, and Friday.  Inflamed seborrheic keratosis left nasal root  Symptomatic, irritating, patient would like treated.  Benign-appearing.  Call clinic for new or changing lesions.    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 Exam most consistent with a lipoma. Discussed that a lipoma is a benign fatty growth that can grow over time and sometimes get irritated. Recommend observation if it is not bothersome or changing. Discussed option of ILK injections or surgical excision to remove it if it is growing, symptomatic, or other changes noted. Please call for new or changing lesions so they can be evaluated.   Return in about 1 year (around 07/23/2024) for AK follow up, Seb Derm.  Anise Salvo, RMA, am acting as scribe for Armida Sans, MD .   Documentation: I have reviewed the above documentation for accuracy and completeness, and I agree with the above.  Armida Sans, MD

## 2023-07-24 NOTE — Patient Instructions (Signed)
Treatment Plan: Continue ketoconazole 2% cream daily as needed. Recommend patient use it Monday, Wednesday and Fridays at bed time.    Cryotherapy Aftercare  Wash gently with soap and water everyday.   Apply Vaseline and Band-Aid daily until healed.    Due to recent changes in healthcare laws, you may see results of your pathology and/or laboratory studies on MyChart before the doctors have had a chance to review them. We understand that in some cases there may be results that are confusing or concerning to you. Please understand that not all results are received at the same time and often the doctors may need to interpret multiple results in order to provide you with the best plan of care or course of treatment. Therefore, we ask that you please give [place] 2 business days to thoroughly review all your results before contacting the office for clarification. Should we see a critical lab result, you will be contacted sooner.   If You Need Anything After Your Visit  If you have any questions or concerns for your doctor, please call our main line at [phone] and press option 4 to reach your doctor's medical assistant. If no one answers, please leave a voicemail as directed and we will return your call as soon as possible. Messages left after 4 pm will be answered the following business day.   You may also send [place] a message via MyChart. We typically respond to MyChart messages within 1-2 business days.  For prescription refills, please ask your pharmacy to contact our office. Our fax number is [phone].  If you have an urgent issue when the clinic is closed that cannot wait until the next business day, you can page your doctor at the number below.    Please note that while we do our best to be available for urgent issues outside of office hours, we are not available 24/7.   If you have an urgent issue and are unable to reach [place], you may choose to seek medical care at your doctor's office,  retail clinic, urgent care center, or emergency room.  If you have a medical emergency, please immediately call 911 or go to the emergency department.  Pager Numbers  - Dr. Gwen Pounds: [phone]  - Dr. Roseanne Reno: [phone]  - Dr. Katrinka Blazing: [phone]   In the event of inclement weather, please call our main line at [phone] for an update on the status of any delays or closures.  Dermatology Medication Tips: Please keep the boxes that topical medications come in in order to help keep track of the instructions about where and how to use these. Pharmacies typically print the medication instructions only on the boxes and not directly on the medication tubes.   If your medication is too expensive, please contact our office at [phone] option 4 or send [place] a message through MyChart.   We are unable to tell what your co-pay for medications will be in advance as this is different depending on your insurance coverage. However, we may be able to find a substitute medication at lower cost or fill out paperwork to get insurance to cover a needed medication.   If a prior authorization is required to get your medication covered by your insurance company, please allow [place] 1-2 business days to complete this process.  Drug prices often vary depending on where the prescription is filled and some pharmacies may offer cheaper prices.  The website www.goodrx.com contains coupons for medications through different pharmacies. The prices here do  not account for what the cost may be with help from insurance (it may be cheaper with your insurance), but the website can give you the price if you did not use any insurance.  - You can print the associated coupon and take it with your prescription to the pharmacy.  - You may also stop by our office during regular business hours and pick up a GoodRx coupon card.  - If you need your prescription sent electronically to a different pharmacy, notify our office  through [facility] or by phone at [phone] option 4.

## 2024-07-28 ENCOUNTER — Ambulatory Visit (INDEPENDENT_AMBULATORY_CARE_PROVIDER_SITE_OTHER): Payer: Medicare PPO | Admitting: Dermatology

## 2024-07-28 ENCOUNTER — Encounter: Payer: Self-pay | Admitting: Dermatology

## 2024-07-28 DIAGNOSIS — Z1283 Encounter for screening for malignant neoplasm of skin: Secondary | ICD-10-CM

## 2024-07-28 DIAGNOSIS — D229 Melanocytic nevi, unspecified: Secondary | ICD-10-CM

## 2024-07-28 DIAGNOSIS — Z79899 Other long term (current) drug therapy: Secondary | ICD-10-CM

## 2024-07-28 DIAGNOSIS — L821 Other seborrheic keratosis: Secondary | ICD-10-CM

## 2024-07-28 DIAGNOSIS — L578 Other skin changes due to chronic exposure to nonionizing radiation: Secondary | ICD-10-CM

## 2024-07-28 DIAGNOSIS — L820 Inflamed seborrheic keratosis: Secondary | ICD-10-CM

## 2024-07-28 DIAGNOSIS — W908XXA Exposure to other nonionizing radiation, initial encounter: Secondary | ICD-10-CM | POA: Diagnosis not present

## 2024-07-28 DIAGNOSIS — L570 Actinic keratosis: Secondary | ICD-10-CM | POA: Diagnosis not present

## 2024-07-28 DIAGNOSIS — Z7189 Other specified counseling: Secondary | ICD-10-CM

## 2024-07-28 DIAGNOSIS — D1801 Hemangioma of skin and subcutaneous tissue: Secondary | ICD-10-CM

## 2024-07-28 DIAGNOSIS — Z86018 Personal history of other benign neoplasm: Secondary | ICD-10-CM

## 2024-07-28 DIAGNOSIS — L219 Seborrheic dermatitis, unspecified: Secondary | ICD-10-CM

## 2024-07-28 DIAGNOSIS — L814 Other melanin hyperpigmentation: Secondary | ICD-10-CM

## 2024-07-28 DIAGNOSIS — D692 Other nonthrombocytopenic purpura: Secondary | ICD-10-CM | POA: Diagnosis not present

## 2024-07-28 NOTE — Patient Instructions (Addendum)
 Cryotherapy Aftercare  Wash gently with soap and water  everyday.   Apply Vaseline Jelly daily until healed.    Continue ketoconazole  2% cream daily as needed to chest. Recommend patient use it Monday, Wednesday and Fridays at bed time.   Recommend daily broad spectrum sunscreen SPF 30+ to sun-exposed areas, reapply every 2 hours as needed. Call for new or changing lesions.  Staying in the shade or wearing long sleeves, sun glasses (UVA+UVB protection) and wide brim hats (4-inch brim around the entire circumference of the hat) are also recommended for sun protection.      Melanoma ABCDEs  Melanoma is the most dangerous type of skin cancer, and is the leading cause of death from skin disease.  You are more likely to develop melanoma if you: Have light-colored skin, light-colored eyes, or red or blond hair Spend a lot of time in the sun Tan regularly, either outdoors or in a tanning bed Have had blistering sunburns, especially during childhood Have a close family member who has had a melanoma Have atypical moles or large birthmarks  Early detection of melanoma is key since treatment is typically straightforward and cure rates are extremely high if we catch it early.   The first sign of melanoma is often a change in a mole or a new dark spot.  The ABCDE system is a way of remembering the signs of melanoma.  A for asymmetry:  The two halves do not match. B for border:  The edges of the growth are irregular. C for color:  A mixture of colors are present instead of an even brown color. D for diameter:  Melanomas are usually (but not always) greater than 6mm - the size of a pencil eraser. E for evolution:  The spot keeps changing in size, shape, and color.  Please check your skin once per month between visits. You can use a small mirror in front and a large mirror behind you to keep an eye on the back side or your body.   If you see any new or changing lesions before your next follow-up,  please call to schedule a visit.  Please continue daily skin protection including broad spectrum sunscreen SPF 30+ to sun-exposed areas, reapplying every 2 hours as needed when you're outdoors.   Staying in the shade or wearing long sleeves, sun glasses (UVA+UVB protection) and wide brim hats (4-inch brim around the entire circumference of the hat) are also recommended for sun protection.      Due to recent changes in healthcare laws, you may see results of your pathology and/or laboratory studies on MyChart before the doctors have had a chance to review them. We understand that in some cases there may be results that are confusing or concerning to you. Please understand that not all results are received at the same time and often the doctors may need to interpret multiple results in order to provide you with the best plan of care or course of treatment. Therefore, we ask that you please give us  2 business days to thoroughly review all your results before contacting the office for clarification. Should we see a critical lab result, you will be contacted sooner.   If You Need Anything After Your Visit  If you have any questions or concerns for your doctor, please call our main line at [phone] and press option 4 to reach your doctor's medical assistant. If no one answers, please leave a voicemail as directed and we will return your call as  soon as possible. Messages left after 4 pm will be answered the following business day.   You may also send us  a message via MyChart. We typically respond to MyChart messages within 1-2 business days.  For prescription refills, please ask your pharmacy to contact our office. Our fax number is [phone].  If you have an urgent issue when the clinic is closed that cannot wait until the next business day, you can page your doctor at the number below.    Please note that while we do our best to be available for urgent issues outside of office hours, we are  not available 24/7.   If you have an urgent issue and are unable to reach us , you may choose to seek medical care at your doctor's office, retail clinic, urgent care center, or emergency room.  If you have a medical emergency, please immediately call 911 or go to the emergency department.  Pager Numbers  - Dr. Hester: [phone]  - Dr. Jackquline: [phone]  - Dr. Claudene: [phone]   - Dr. Raymund: [phone]  In the event of inclement weather, please call our main line at [phone] for an update on the status of any delays or closures.  Dermatology Medication Tips: Please keep the boxes that topical medications come in in order to help keep track of the instructions about where and how to use these. Pharmacies typically print the medication instructions only on the boxes and not directly on the medication tubes.   If your medication is too expensive, please contact our office at [phone] option 4 or send us  a message through MyChart.   We are unable to tell what your co-pay for medications will be in advance as this is different depending on your insurance coverage. However, we may be able to find a substitute medication at lower cost or fill out paperwork to get insurance to cover a needed medication.   If a prior authorization is required to get your medication covered by your insurance company, please allow us  1-2 business days to complete this process.  Drug prices often vary depending on where the prescription is filled and some pharmacies may offer cheaper prices.  The website www.goodrx.com contains coupons for medications through different pharmacies. The prices here do not account for what the cost may be with help from insurance (it may be cheaper with your insurance), but the website can give you the price if you did not use any insurance.  - You can print the associated coupon and take it with your prescription to the pharmacy.  - You may also stop by our  office during regular business hours and pick up a GoodRx coupon card.  - If you need your prescription sent electronically to a different pharmacy, notify our office through [facility] or by phone at [phone] option 4.     Si Usted Necesita Algo Despus de Su Visita  Tambin puede enviarnos un mensaje a travs de Clinical Cytogeneticist. Por lo general respondemos a los mensajes de MyChart en el transcurso de 1 a 2 das hbiles.  Para renovar recetas, por favor pida a su farmacia que se ponga en contacto con nuestra oficina. Randi lakes de fax es [place] [phone].  Si tiene un asunto urgente cuando la clnica est cerrada y que no puede esperar hasta el siguiente da hbil, puede llamar/localizar a su doctor(a) al nmero que aparece a continuacin.   Por favor, tenga en cuenta que aunque hacemos todo lo posible para estar disponibles  para asuntos urgentes fuera del horario de [place], no estamos disponibles las 24 horas del da, los 7 [address]  [address] de la [place].   Si tiene un problema urgente y no puede comunicarse con nosotros, puede optar por buscar atencin mdica  en el consultorio de su doctor(a), en una clnica privada, en un centro de atencin urgente o en una sala de emergencias.  Si tiene engineer, drilling, por favor llame inmediatamente al 911 o vaya a la sala de emergencias.  Nmeros de bper  - Dr. Hester: [phone]  - Dra. Jackquline: [phone]  - Dr. Claudene: [phone]  - Dra. Kitts: [phone]  En caso de inclemencias del [place], por favor llame a nuestra lnea principal al [phone] para una actualizacin sobre el estado de cualquier retraso o cierre.  Consejos para la medicacin en dermatologa: Por favor, guarde las cajas en las que vienen los medicamentos de uso tpico para ayudarle a seguir las instrucciones sobre dnde y cmo usarlos. Las farmacias generalmente imprimen las instrucciones del medicamento slo en las cajas y no directamente en los tubos del  [place].   Si su medicamento es muy caro, por favor, pngase en contacto con landry rieger llamando al [phone] y presione la opcin 4 o envenos un mensaje a travs de Clinical Cytogeneticist.   No podemos decirle cul ser su copago por los medicamentos por adelantado ya que esto es diferente dependiendo de la cobertura de su seguro. Sin embargo, es posible que podamos encontrar un medicamento sustituto a audiological scientist un formulario para que el seguro cubra el medicamento que se considera necesario.   Si se requiere una autorizacin previa para que su compaa de seguros cubra su medicamento, por favor permtanos de 1 a 2 das hbiles para completar este proceso.  Los precios de los medicamentos varan con frecuencia dependiendo del environmental consultant de dnde se surte la receta y alguna farmacias pueden ofrecer precios ms baratos.  El sitio web www.goodrx.com tiene cupones para medicamentos de health and safety inspector. Los precios aqu no tienen en cuenta lo que podra costar con la ayuda del seguro (puede ser ms barato con su seguro), pero el sitio web puede darle el precio si no utiliz tourist information centre manager.  - Puede imprimir el cupn correspondiente y llevarlo con su receta a la farmacia.  - Tambin puede pasar por nuestra oficina durante el horario de atencin regular y education officer, museum una tarjeta de cupones de GoodRx.  - Si necesita que su receta se enve electrnicamente a una farmacia diferente, informe a nuestra oficina a travs de MyChart de [place] o por telfono llamando al [phone] y presione la opcin 4.

## 2024-07-28 NOTE — Progress Notes (Signed)
 Follow-Up Visit   Subjective  Dwayne Smith is a 78 y.o. male who presents for the following: Skin Cancer Screening and Upper Body Skin Exam. Hx of DN. Hx of AKs.   Seborrheic dermatitis yearly follow up. Chest. Uses Ketoconazole  2% cream.   The patient presents for Upper Body Skin Exam (UBSE) for skin cancer screening and mole check. The patient has spots, moles and lesions to be evaluated, some may be new or changing and the patient may have concern these could be cancer.    The following portions of the chart were reviewed this encounter and updated as appropriate: medications, allergies, medical history  Review of Systems:  No other skin or systemic complaints except as noted in HPI or Assessment and Plan.  Objective  Well appearing patient in no apparent distress; mood and affect are within normal limits.  All skin waist up examined. Relevant physical exam findings are noted in the Assessment and Plan.  scalp, ears x11 (11) Erythematous thin papules/macules with gritty scale.  arms x6 (6) Erythematous keratotic or waxy stuck-on papule or plaque. Left nose superior proximal nasal ala x1 Tan scaly lesion   Assessment & Plan   AK (ACTINIC KERATOSIS) (11) scalp, ears x11 (11) Actinic keratoses are precancerous spots that appear secondary to cumulative UV radiation exposure/sun exposure over time. They are chronic with expected duration over 1 year. A portion of actinic keratoses will progress to squamous cell carcinoma of the skin. It is not possible to reliably predict which spots will progress to skin cancer and so treatment is recommended to prevent development of skin cancer.  Recommend daily broad spectrum sunscreen SPF 30+ to sun-exposed areas, reapply every 2 hours as needed.  Recommend staying in the shade or wearing long sleeves, sun glasses (UVA+UVB protection) and wide brim hats (4-inch brim around the entire circumference of the hat). Call for new or changing  lesions. Destruction of lesion - scalp, ears x11 (11) Complexity: simple   Destruction method: cryotherapy   Informed consent: discussed and consent obtained   Timeout:  patient name, date of birth, surgical site, and procedure verified Lesion destroyed using liquid nitrogen: Yes   Region frozen until ice ball extended beyond lesion: Yes   Outcome: patient tolerated procedure well with no complications   Post-procedure details: wound care instructions given   Additional details:  Prior to procedure, discussed risks of blister formation, small wound, skin dyspigmentation, or rare scar following cryotherapy. Recommend Vaseline ointment to treated areas while healing.   INFLAMED SEBORRHEIC KERATOSIS (6) arms x6 (6) Symptomatic, irritating, patient would like treated. Destruction of lesion - arms x6 (6) Complexity: simple   Destruction method: cryotherapy   Informed consent: discussed and consent obtained   Timeout:  patient name, date of birth, surgical site, and procedure verified Lesion destroyed using liquid nitrogen: Yes   Region frozen until ice ball extended beyond lesion: Yes   Outcome: patient tolerated procedure well with no complications   Post-procedure details: wound care instructions given   Additional details:  Prior to procedure, discussed risks of blister formation, small wound, skin dyspigmentation, or rare scar following cryotherapy. Recommend Vaseline ointment to treated areas while healing.   PIGMENTED ACTINIC KERATOSIS Left nose superior proximal nasal ala x1 Destruction of lesion - Left nose superior proximal nasal ala x1 Complexity: simple   Destruction method: cryotherapy   Informed consent: discussed and consent obtained   Timeout:  patient name, date of birth, surgical site, and procedure verified Lesion destroyed  using liquid nitrogen: Yes   Region frozen until ice ball extended beyond lesion: Yes   Outcome: patient tolerated procedure well with no  complications   Post-procedure details: wound care instructions given   Additional details:  Prior to procedure, discussed risks of blister formation, small wound, skin dyspigmentation, or rare scar following cryotherapy. Recommend Vaseline ointment to treated areas while healing.   Skin cancer screening performed today.  HISTORY OF DYSPLASTIC NEVUS No evidence of recurrence today Recommend regular full body skin exams Recommend daily broad spectrum sunscreen SPF 30+ to sun-exposed areas, reapply every 2 hours as needed.  Call if any new or changing lesions are noted between office visits   Actinic Damage - Chronic condition, secondary to cumulative UV/sun exposure - diffuse scaly erythematous macules with underlying dyspigmentation - Recommend daily broad spectrum sunscreen SPF 30+ to sun-exposed areas, reapply every 2 hours as needed.  - Staying in the shade or wearing long sleeves, sun glasses (UVA+UVB protection) and wide brim hats (4-inch brim around the entire circumference of the hat) are also recommended for sun protection.  - Call for new or changing lesions.  Lentigines, Seborrheic Keratoses, Hemangiomas - Benign normal skin lesions - Benign-appearing - Call for any changes  Melanocytic Nevi - Tan-brown and/or pink-flesh-colored symmetric macules and papules - Benign appearing on exam today - Observation - Call clinic for new or changing moles - Recommend daily use of broad spectrum spf 30+ sunscreen to sun-exposed areas.   Purpura - Chronic; persistent and recurrent.  Treatable, but not curable. - Violaceous macules and patches - Benign - Related to trauma, age, sun damage and/or use of blood thinners, chronic use of topical and/or oral steroids - Observe - Can use OTC arnica containing moisturizer such as Dermend Bruise Formula if desired - Call for worsening or other concerns  SEBORRHEIC DERMATITIS Exam: Pink patches with greasy scale at chest   Chronic and  persistent condition with duration or expected duration over one year. Condition is bothersome/symptomatic for patient. Currently flared at chest.     Seborrheic Dermatitis is a chronic persistent rash characterized by pinkness and scaling most commonly of the mid face but also can occur on the scalp (dandruff), ears; mid chest, mid back and groin.  It tends to be exacerbated by stress and cooler weather.  People who have neurologic disease may experience new onset or exacerbation of existing seborrheic dermatitis.  The condition is not curable but treatable and can be controlled.   Treatment Plan: Continue ketoconazole  2% cream daily as needed. Recommend patient use it Monday, Wednesday and Fridays at bed time.  Patient defers refills today, will call when needed.    Return for AK Follow Up in 4-5 months.  I, Lizann Edelman, CMA, am acting as scribe for Alm Rhyme, MD.   Documentation: I have reviewed the above documentation for accuracy and completeness, and I agree with the above.  Alm Rhyme, MD

## 2024-08-03 ENCOUNTER — Encounter: Payer: Self-pay | Admitting: Dermatology

## 2024-08-30 ENCOUNTER — Other Ambulatory Visit: Payer: Self-pay | Admitting: Internal Medicine

## 2024-08-30 DIAGNOSIS — N281 Cyst of kidney, acquired: Secondary | ICD-10-CM

## 2024-12-01 ENCOUNTER — Ambulatory Visit: Admitting: Dermatology
